# Patient Record
Sex: Female | Born: 1985 | Race: Black or African American | Hispanic: No | Marital: Married | Smoking: Current some day smoker
Health system: Southern US, Community
[De-identification: ages and names within clinical notes are randomized; demographics above are authoritative.]

## PROBLEM LIST (undated history)

## (undated) DIAGNOSIS — O99119 Other diseases of the blood and blood-forming organs and certain disorders involving the immune mechanism complicating pregnancy, unspecified trimester: Secondary | ICD-10-CM

## (undated) DIAGNOSIS — R519 Headache, unspecified: Secondary | ICD-10-CM

## (undated) DIAGNOSIS — D6851 Activated protein C resistance: Secondary | ICD-10-CM

## (undated) DIAGNOSIS — Z789 Other specified health status: Secondary | ICD-10-CM

## (undated) HISTORY — PX: TUBAL LIGATION: SHX77

## (undated) HISTORY — DX: Headache, unspecified: R51.9

## (undated) HISTORY — PX: NO PAST SURGERIES: SHX2092

---

## 2007-08-27 ENCOUNTER — Emergency Department (HOSPITAL_COMMUNITY): Admission: EM | Admit: 2007-08-27 | Discharge: 2007-08-27 | Payer: Self-pay | Admitting: Emergency Medicine

## 2008-09-14 ENCOUNTER — Inpatient Hospital Stay (HOSPITAL_COMMUNITY): Admission: AD | Admit: 2008-09-14 | Discharge: 2008-09-14 | Payer: Self-pay | Admitting: Family Medicine

## 2009-09-13 ENCOUNTER — Emergency Department (HOSPITAL_COMMUNITY): Admission: EM | Admit: 2009-09-13 | Discharge: 2009-09-13 | Payer: Self-pay | Admitting: Emergency Medicine

## 2009-10-08 ENCOUNTER — Emergency Department (HOSPITAL_COMMUNITY): Admission: EM | Admit: 2009-10-08 | Discharge: 2009-10-08 | Payer: Self-pay | Admitting: Family Medicine

## 2010-09-25 ENCOUNTER — Inpatient Hospital Stay (HOSPITAL_COMMUNITY): Payer: Medicaid Other

## 2010-09-25 ENCOUNTER — Inpatient Hospital Stay (HOSPITAL_COMMUNITY)
Admission: AD | Admit: 2010-09-25 | Discharge: 2010-09-25 | Disposition: A | Discharge status: Home / Self Care | Payer: Medicaid Other | Source: Ambulatory Visit | Attending: Obstetrics and Gynecology | Admitting: Obstetrics and Gynecology

## 2010-09-25 DIAGNOSIS — N760 Acute vaginitis: Secondary | ICD-10-CM | POA: Insufficient documentation

## 2010-09-25 DIAGNOSIS — A499 Bacterial infection, unspecified: Secondary | ICD-10-CM | POA: Insufficient documentation

## 2010-09-25 DIAGNOSIS — O2390 Unspecified genitourinary tract infection in pregnancy, unspecified trimester: Secondary | ICD-10-CM | POA: Insufficient documentation

## 2010-09-25 DIAGNOSIS — O209 Hemorrhage in early pregnancy, unspecified: Secondary | ICD-10-CM | POA: Insufficient documentation

## 2010-09-25 DIAGNOSIS — O039 Complete or unspecified spontaneous abortion without complication: Secondary | ICD-10-CM | POA: Insufficient documentation

## 2010-09-25 DIAGNOSIS — B9689 Other specified bacterial agents as the cause of diseases classified elsewhere: Secondary | ICD-10-CM | POA: Insufficient documentation

## 2010-09-25 LAB — WET PREP, GENITAL: Trich, Wet Prep: NONE SEEN

## 2010-09-25 LAB — URINALYSIS, ROUTINE W REFLEX MICROSCOPIC
Bilirubin Urine: NEGATIVE
Glucose, UA: NEGATIVE mg/dL
Hgb urine dipstick: NEGATIVE
Ketones, ur: NEGATIVE mg/dL
Specific Gravity, Urine: 1.015 (ref 1.005–1.030)
pH: 6.5 (ref 5.0–8.0)

## 2010-09-27 LAB — GC/CHLAMYDIA PROBE AMP, GENITAL
Chlamydia, DNA Probe: NEGATIVE
GC Probe Amp, Genital: NEGATIVE

## 2010-10-02 DEATH — deceased

## 2010-10-13 LAB — WET PREP, GENITAL: Yeast Wet Prep HPF POC: NONE SEEN

## 2010-10-13 LAB — HCG, QUANTITATIVE, PREGNANCY: hCG, Beta Chain, Quant, S: 39 m[IU]/mL — ABNORMAL HIGH (ref <5)

## 2010-11-11 ENCOUNTER — Ambulatory Visit (HOSPITAL_COMMUNITY)
Admission: RE | Admit: 2010-11-11 | Discharge: 2010-11-11 | Disposition: A | Discharge status: Home / Self Care | Payer: Medicaid Other | Source: Ambulatory Visit | Attending: Obstetrics and Gynecology | Admitting: Obstetrics and Gynecology

## 2010-11-11 ENCOUNTER — Ambulatory Visit (HOSPITAL_COMMUNITY)
Admission: RE | Admit: 2010-11-11 | Discharge: 2010-11-11 | Disposition: A | Discharge status: Home / Self Care | Payer: Medicaid Other | Source: Ambulatory Visit

## 2010-11-11 DIAGNOSIS — N96 Recurrent pregnancy loss: Secondary | ICD-10-CM | POA: Insufficient documentation

## 2011-07-04 NOTE — L&D Delivery Note (Signed)
Delivery Note At 9:31 AM a viable female was delivered via Vaginal, Spontaneous Delivery (Presentation: Right Occiput Posterior).  APGAR: 8, 9; weight .   Placenta status: Intact, Spontaneous.  Cord: 3 vessels with the following complications: None.   Anesthesia: Epidural  Episiotomy: None Lacerations: None Suture Repair: none Est. Blood Loss (mL): 100  Mom to postpartum.  Baby to room in with mom.  Dagmar Adcox 05/01/2012, 9:44 AM

## 2011-08-09 ENCOUNTER — Ambulatory Visit: Payer: Self-pay

## 2011-08-09 ENCOUNTER — Ambulatory Visit: Payer: Self-pay | Admitting: Family Medicine

## 2011-08-09 VITALS — BP 112/78 | HR 112 | Temp 101.4°F | Resp 18 | Ht 63.5 in | Wt 179.6 lb

## 2011-08-09 DIAGNOSIS — M79603 Pain in arm, unspecified: Secondary | ICD-10-CM

## 2011-08-09 DIAGNOSIS — S53409A Unspecified sprain of unspecified elbow, initial encounter: Secondary | ICD-10-CM

## 2011-08-09 DIAGNOSIS — J020 Streptococcal pharyngitis: Secondary | ICD-10-CM

## 2011-08-09 DIAGNOSIS — IMO0002 Reserved for concepts with insufficient information to code with codable children: Secondary | ICD-10-CM

## 2011-08-09 DIAGNOSIS — M79609 Pain in unspecified limb: Secondary | ICD-10-CM

## 2011-08-09 DIAGNOSIS — J029 Acute pharyngitis, unspecified: Secondary | ICD-10-CM

## 2011-08-09 LAB — POCT RAPID STREP A (OFFICE): Rapid Strep A Screen: POSITIVE — AB

## 2011-08-09 MED ORDER — NAPROXEN 500 MG PO TABS: 500.0000 mg | ORAL_TABLET | Freq: Two times a day (BID) | ORAL | Status: DC

## 2011-08-09 MED ORDER — AMOXICILLIN 875 MG PO TABS: 875.0000 mg | ORAL_TABLET | Freq: Two times a day (BID) | ORAL | Status: AC

## 2011-08-09 NOTE — Patient Instructions (Signed)
Thank you for coming in today.  I appreciate your patience as we become more comfortable with our computer system.  Today you saw Angel Potenza, MD. I hope you feel better quickly. If you were not given printed prescriptions today, your medications have been sent to your specified pharmacy and can be picked up there.   

## 2011-08-09 NOTE — Progress Notes (Signed)
  Subjective:    Patient ID: Trellis Moment, female    DOB: [birth year], 26 y.o.   MRN: [id number]  HPI 26 yo female here for arm pain - boyfriend fell on arm yesterday.  Swollen.  Can't bend or fully straighten.  Using ice. Normal sensation and movement in fingers.    Also - fever today.  Headache,  Slight cough, sore throat.  No flu shot.  Achy.  No ear pain.  Slight runny nose.  Symptoms started yest.  Fever today.    Review of Systems Negative except as per HPI     Objective:   Physical Exam  Constitutional: She appears well-developed and well-nourished.  HENT:  Mouth/Throat: Uvula is midline and mucous membranes are normal. Oropharyngeal exudate and posterior oropharyngeal erythema present.  Eyes: Conjunctivae and EOM are normal.  Neck: Normal range of motion.  Cardiovascular: Normal rate, regular rhythm and normal heart sounds.   Pulmonary/Chest: Effort normal and breath sounds normal.  Musculoskeletal:       Left elbow: She exhibits decreased range of motion and swelling. She exhibits no effusion and no deformity. tenderness found. Medial epicondyle and lateral epicondyle tenderness noted.  Lymphadenopathy:    She has no cervical adenopathy.    Results for orders placed in visit on 08/09/11  POCT RAPID STREP A (OFFICE)      Component Value Range   Rapid Strep A Screen Positive (*) Negative     [facility] Primary radiology reading by Dr. Georgiana Shore: No fracture seen       Assessment & Plan:  Elbow pain Sprain  Sling, ice, naproxen.  F/u if not better in 3-5 days  St, fever - strep.  Amox to pharmacy.

## 2011-08-23 ENCOUNTER — Emergency Department (HOSPITAL_COMMUNITY)
Admission: EM | Admit: 2011-08-23 | Discharge: 2011-08-23 | Disposition: A | Discharge status: Home / Self Care | Payer: Self-pay | Attending: Emergency Medicine | Admitting: Emergency Medicine

## 2011-08-23 ENCOUNTER — Encounter (HOSPITAL_COMMUNITY): Payer: Self-pay | Admitting: *Deleted

## 2011-08-23 DIAGNOSIS — B9689 Other specified bacterial agents as the cause of diseases classified elsewhere: Secondary | ICD-10-CM | POA: Insufficient documentation

## 2011-08-23 DIAGNOSIS — N760 Acute vaginitis: Secondary | ICD-10-CM | POA: Insufficient documentation

## 2011-08-23 DIAGNOSIS — A499 Bacterial infection, unspecified: Secondary | ICD-10-CM | POA: Insufficient documentation

## 2011-08-23 DIAGNOSIS — L293 Anogenital pruritus, unspecified: Secondary | ICD-10-CM | POA: Insufficient documentation

## 2011-08-23 LAB — URINALYSIS, ROUTINE W REFLEX MICROSCOPIC
Bilirubin Urine: NEGATIVE
Hgb urine dipstick: NEGATIVE
Nitrite: NEGATIVE
Protein, ur: NEGATIVE mg/dL
Specific Gravity, Urine: 1.021 (ref 1.005–1.030)
Urobilinogen, UA: 1 mg/dL (ref 0.0–1.0)

## 2011-08-23 LAB — URINE MICROSCOPIC-ADD ON

## 2011-08-23 LAB — WET PREP, GENITAL

## 2011-08-23 MED ORDER — METRONIDAZOLE 500 MG PO TABS: 500.0000 mg | ORAL_TABLET | Freq: Two times a day (BID) | ORAL | Status: AC

## 2011-08-23 MED ORDER — AZITHROMYCIN 250 MG PO TABS
1000.0000 mg | ORAL_TABLET | Freq: Once | ORAL | Status: AC
  Administered 2011-08-23: 1000 mg via ORAL
  Filled 2011-08-23: qty 4

## 2011-08-23 MED ORDER — IBUPROFEN 800 MG PO TABS: 800.0000 mg | ORAL_TABLET | Freq: Once | ORAL | Status: DC

## 2011-08-23 MED ORDER — CEFTRIAXONE SODIUM 250 MG IJ SOLR
250.0000 mg | Freq: Once | INTRAMUSCULAR | Status: AC
  Administered 2011-08-23: 250 mg via INTRAMUSCULAR
  Filled 2011-08-23: qty 250

## 2011-08-23 MED ORDER — MICONAZOLE NITRATE 2 % EX CREA: TOPICAL_CREAM | Freq: Two times a day (BID) | CUTANEOUS | Status: DC

## 2011-08-23 NOTE — ED Notes (Signed)
Patient aware of need for urine testing. Patient unable to void at this time. Patient encouraged to call for toileting assistance  

## 2011-08-23 NOTE — ED Provider Notes (Signed)
History     CSN: [id number]  Arrival date & time 08/23/11  1628   First MD Initiated Contact with Patient 08/23/11 1637      Chief Complaint  Patient presents with  . SEXUALLY TRANSMITTED DISEASE    (Consider location/radiation/quality/duration/timing/severity/associated sxs/prior treatment) HPI  Patient who is G3P0A3 with LMP on January 20th presents to ER complaining of a one week hx of vaginal itching but denies fevers, chills, abdominal pain, n/v/d, pelvic pain or dyspareunia. Patient states she recently completed abx for strep throat stating "sore throat is gone." Patient states she has hx of gonorrhea and chlamydia but denies vaginal d/c. Symptoms were gradual onset, persistent and unchanging. She has taken nothing for symptoms PTA. Denies aggravating or alleviating factors.   History reviewed. No pertinent past medical history.  History reviewed. No pertinent past surgical history.  History reviewed. No pertinent family history.  History  Substance Use Topics  . Smoking status: Former Smoker    Types: Cigarettes    Quit date: 04/08/2011  . Smokeless tobacco: Not on file  . Alcohol Use: Not on file    OB History    Grav Para Term Preterm Abortions TAB SAB Ect Mult Living                  Review of Systems  All other systems reviewed and are negative.    Allergies  Review of patient's allergies indicates no known allergies.  Home Medications   Current Outpatient Rx  Name Route Sig Dispense Refill  . NAPROXEN 500 MG PO TABS Oral Take 1 tablet (500 mg total) by mouth 2 (two) times daily with a meal. 30 tablet 0    BP 127/77  Pulse 101  Temp(Src) 98.3 F (36.8 C) (Oral)  Resp 20  SpO2 100%  LMP 07/23/2011  Physical Exam  Nursing note and vitals reviewed. Constitutional: She is oriented to person, place, and time. She appears well-developed and well-nourished. No distress.  HENT:  Head: Normocephalic and atraumatic.  Eyes: Conjunctivae are normal.    Neck: Normal range of motion. Neck supple.  Cardiovascular: Normal rate, regular rhythm, normal heart sounds and intact distal pulses.  Exam reveals no gallop and no friction rub.   No murmur heard. Pulmonary/Chest: Effort normal and breath sounds normal. No respiratory distress. She has no wheezes. She has no rales. She exhibits no tenderness.  Abdominal: Soft. Bowel sounds are normal. She exhibits no distension and no mass. There is no tenderness. There is no rebound and no guarding.  Genitourinary: There is no tenderness or lesion on the right labia. There is no tenderness or lesion on the left labia. Cervix exhibits no motion tenderness, no discharge and no friability. Right adnexum displays no mass and no tenderness. Left adnexum displays no mass and no tenderness. Vaginal discharge found.       Thick white scant vaginal d/c. No cervical d/c. No CMT or adnexal TTP.   Musculoskeletal: Normal range of motion. She exhibits no edema and no tenderness.  Neurological: She is alert and oriented to person, place, and time.  Skin: Skin is warm and dry. No rash noted. She is not diaphoretic. No erythema.  Psychiatric: She has a normal mood and affect.    ED Course  Procedures (including critical care time)  Patient is requesting prophylactic treatment for GC and chlamydia.  IM rocephin and PO zithromax.   Labs Reviewed  URINALYSIS, ROUTINE W REFLEX MICROSCOPIC - Abnormal; Notable for the following:  Leukocytes, UA SMALL (*)    All other components within normal limits  WET PREP, GENITAL - Abnormal; Notable for the following:    Clue Cells Wet Prep HPF POC MODERATE (*)    WBC, Wet Prep HPF POC FEW (*)    All other components within normal limits  POCT PREGNANCY, URINE  URINE MICROSCOPIC-ADD ON  GC/CHLAMYDIA PROBE AMP, GENITAL   No results found.   1. Bacterial vaginosis       MDM  Patient with BV, though negative for yeast on wet prep, question yeast component. Patient  prophylacticly treated for GC/chlamydia given hx of STDs. Abdomen is soft and non tender. No CMT or adnexal TTP.         Jenness [place], [place] 08/23/11 [phone]

## 2011-08-23 NOTE — ED Notes (Signed)
Pt in c/o itching to vaginal area x2 weeks

## 2011-08-23 NOTE — Discharge Instructions (Signed)
Take flagyl in completion and use miconazole as directed. Follow up with the [facility] STD clinic in the future with concerns of potential STD infections.   Bacterial Vaginosis Bacterial vaginosis (BV) is a vaginal infection where the normal balance of bacteria in the vagina is disrupted. The normal balance is then replaced by an overgrowth of certain bacteria. There are several different kinds of bacteria that can cause BV. BV is the most common vaginal infection in women of childbearing age. CAUSES   The cause of BV is not fully understood. BV develops when there is an increase or imbalance of harmful bacteria.   Some activities or behaviors can upset the normal balance of bacteria in the vagina and put women at increased risk including:   Having a new sex partner or multiple sex partners.   Douching.   Using an intrauterine device (IUD) for contraception.   It is not clear what role sexual activity plays in the development of BV. However, women that have never had sexual intercourse are rarely infected with BV.  Women do not get BV from toilet seats, bedding, swimming pools or from touching objects around them.  SYMPTOMS   Grey vaginal discharge.   A fish-like odor with discharge, especially after sexual intercourse.   Itching or burning of the vagina and vulva.   Burning or pain with urination.   Some women have no signs or symptoms at all.  DIAGNOSIS  Your caregiver must examine the vagina for signs of BV. Your caregiver will perform lab tests and look at the sample of vaginal fluid through a microscope. They will look for bacteria and abnormal cells (clue cells), a pH test higher than 4.5, and a positive amine test all associated with BV.  RISKS AND COMPLICATIONS   Pelvic inflammatory disease (PID).   Infections following gynecology surgery.   Developing HIV.   Developing herpes virus.  TREATMENT  Sometimes BV will clear up without treatment.  However, all women with symptoms of BV should be treated to avoid complications, especially if gynecology surgery is planned. Female partners generally do not need to be treated. However, BV may spread between female sex partners so treatment is helpful in preventing a recurrence of BV.   BV may be treated with antibiotics. The antibiotics come in either pill or vaginal cream forms. Either can be used with nonpregnant or pregnant women, but the recommended dosages differ. These antibiotics are not harmful to the baby.   BV can recur after treatment. If this happens, a second round of antibiotics will often be prescribed.   Treatment is important for pregnant women. If not treated, BV can cause a premature delivery, especially for a pregnant woman who had a premature birth in the past. All pregnant women who have symptoms of BV should be checked and treated.   For chronic reoccurrence of BV, treatment with a type of prescribed gel vaginally twice a week is helpful.  HOME CARE INSTRUCTIONS   Finish all medication as directed by your caregiver.   Do not have sex until treatment is completed.   Tell your sexual partner that you have a vaginal infection. They should see their caregiver and be treated if they have problems, such as a mild rash or itching.   Practice safe sex. Use condoms. Only have 1 sex partner.  PREVENTION  Basic prevention steps can help reduce the risk of upsetting the natural balance of bacteria in the vagina and developing BV:  Do not  have sexual intercourse (be abstinent).   Do not douche.   Use all of the medicine prescribed for treatment of BV, even if the signs and symptoms go away.   Tell your sex partner if you have BV. That way, they can be treated, if needed, to prevent reoccurrence.  SEEK MEDICAL CARE IF:   Your symptoms are not improving after 3 days of treatment.   You have increased discharge, pain, or fever.  MAKE SURE YOU:   Understand these  instructions.   Will watch your condition.   Will get help right away if you are not doing well or get worse.  FOR MORE INFORMATION  Division of STD Prevention (DSTDP), Centers for Disease Control and Prevention: [employer] American Social Health Association (ASHA): www.ashastd.org  Document Released: 06/19/2005 Document Revised: 03/01/2011 Document Reviewed: 12/10/2008 [facility] Patient Information 2012 [place], [place].

## 2011-08-23 NOTE — ED Notes (Signed)
Patient attempted to void in cup and missed cup or forgot to go in cup.

## 2011-08-23 NOTE — ED Notes (Signed)
Pt given discharge instructions and verb understanding, amb with steady gait to discharge window 

## 2011-08-24 LAB — GC/CHLAMYDIA PROBE AMP, GENITAL: Chlamydia, DNA Probe: NEGATIVE

## 2011-08-24 NOTE — ED Provider Notes (Signed)
Medical screening examination/treatment/procedure(s) were performed by non-physician practitioner and as supervising physician I was immediately available for consultation/collaboration.   Gerhard Munch, MD 08/24/11 0001

## 2011-09-28 LAB — OB RESULTS CONSOLE HEPATITIS B SURFACE ANTIGEN: Hepatitis B Surface Ag: NEGATIVE

## 2011-09-29 LAB — OB RESULTS CONSOLE RPR: RPR: NONREACTIVE

## 2011-09-29 LAB — OB RESULTS CONSOLE GC/CHLAMYDIA: Chlamydia: POSITIVE

## 2011-11-01 ENCOUNTER — Other Ambulatory Visit (HOSPITAL_COMMUNITY): Payer: Self-pay | Admitting: Physician Assistant

## 2011-11-01 DIAGNOSIS — Z0489 Encounter for examination and observation for other specified reasons: Secondary | ICD-10-CM

## 2011-11-01 LAB — OB RESULTS CONSOLE ANTIBODY SCREEN: Antibody Screen: NEGATIVE

## 2011-11-28 ENCOUNTER — Ambulatory Visit (HOSPITAL_COMMUNITY)
Admission: RE | Admit: 2011-11-28 | Discharge: 2011-11-28 | Disposition: A | Discharge status: Home / Self Care | Payer: Medicaid Other | Source: Ambulatory Visit | Attending: Physician Assistant | Admitting: Physician Assistant

## 2011-11-28 DIAGNOSIS — Z1389 Encounter for screening for other disorder: Secondary | ICD-10-CM | POA: Insufficient documentation

## 2011-11-28 DIAGNOSIS — O2620 Pregnancy care for patient with recurrent pregnancy loss, unspecified trimester: Secondary | ICD-10-CM | POA: Insufficient documentation

## 2011-11-28 DIAGNOSIS — Z363 Encounter for antenatal screening for malformations: Secondary | ICD-10-CM | POA: Insufficient documentation

## 2011-11-28 DIAGNOSIS — O358XX0 Maternal care for other (suspected) fetal abnormality and damage, not applicable or unspecified: Secondary | ICD-10-CM | POA: Insufficient documentation

## 2011-11-28 DIAGNOSIS — Z0489 Encounter for examination and observation for other specified reasons: Secondary | ICD-10-CM

## 2011-12-30 LAB — OB RESULTS CONSOLE ABO/RH: RH Type: POSITIVE

## 2012-04-30 ENCOUNTER — Inpatient Hospital Stay (HOSPITAL_COMMUNITY)
Admission: AD | Admit: 2012-04-30 | Discharge: 2012-05-03 | DRG: 775 | Disposition: A | Discharge status: Home / Self Care | Payer: Medicaid Other | Source: Ambulatory Visit | Attending: Obstetrics & Gynecology | Admitting: Obstetrics & Gynecology

## 2012-04-30 ENCOUNTER — Encounter (HOSPITAL_COMMUNITY): Payer: Self-pay | Admitting: *Deleted

## 2012-04-30 HISTORY — DX: Other specified health status: Z78.9

## 2012-04-30 LAB — CBC
HCT: 32.1 % — ABNORMAL LOW (ref 36.0–46.0)
MCHC: 33.6 g/dL (ref 30.0–36.0)
MCV: 86.8 fL (ref 78.0–100.0)
Platelets: 248 10*3/uL (ref 150–400)
RDW: 14.5 % (ref 11.5–15.5)

## 2012-04-30 LAB — RAPID HIV SCREEN (WH-MAU): Rapid HIV Screen: NONREACTIVE

## 2012-04-30 MED ORDER — ONDANSETRON HCL 4 MG/2ML IJ SOLN: 4.0000 mg | Freq: Four times a day (QID) | INTRAMUSCULAR | Status: DC | PRN

## 2012-04-30 MED ORDER — CITRIC ACID-SODIUM CITRATE 334-500 MG/5ML PO SOLN
30.0000 mL | ORAL | Status: DC | PRN
  Filled 2012-04-30: qty 15

## 2012-04-30 MED ORDER — LACTATED RINGERS IV SOLN: 500.0000 mL | INTRAVENOUS | Status: DC | PRN

## 2012-04-30 MED ORDER — IBUPROFEN 600 MG PO TABS: 600.0000 mg | ORAL_TABLET | Freq: Four times a day (QID) | ORAL | Status: DC | PRN

## 2012-04-30 MED ORDER — OXYTOCIN 40 UNITS IN LACTATED RINGERS INFUSION - SIMPLE MED: 62.5000 mL/h | INTRAVENOUS | Status: DC

## 2012-04-30 MED ORDER — LIDOCAINE HCL (PF) 1 % IJ SOLN: 30.0000 mL | INTRAMUSCULAR | Status: DC | PRN

## 2012-04-30 MED ORDER — OXYTOCIN BOLUS FROM INFUSION
500.0000 mL | INTRAVENOUS | Status: DC
  Filled 2012-04-30 (×54): qty 500

## 2012-04-30 MED ORDER — FLEET ENEMA 7-19 GM/118ML RE ENEM: 1.0000 | ENEMA | RECTAL | Status: DC | PRN

## 2012-04-30 MED ORDER — LACTATED RINGERS IV SOLN
INTRAVENOUS | Status: DC
  Administered 2012-05-01: 09:00:00 via INTRAVENOUS

## 2012-04-30 MED ORDER — OXYCODONE-ACETAMINOPHEN 5-325 MG PO TABS: 1.0000 | ORAL_TABLET | ORAL | Status: DC | PRN

## 2012-04-30 MED ORDER — ACETAMINOPHEN 325 MG PO TABS: 650.0000 mg | ORAL_TABLET | ORAL | Status: DC | PRN

## 2012-04-30 NOTE — H&P (Signed)
Angel Ho is a 26 y.o. female presenting for SROM.  Patient [facility].  She presented for a prenatal visit and her membranes ruptured while waiting to see her provider.  Patient is G4P0030 at [redacted]w[redacted]d.  Dating based on LMP and confirmatory [place].  Maternal Medical History:  Reason for admission: Reason for admission: rupture of membranes.  Clear fluid at 10:52 am, 04/30/12  Contractions: Onset was yesterday.   Frequency: regular.   Duration is approximately 60 seconds.   Perceived severity is moderate.    Fetal activity: Perceived fetal activity is normal.   Last perceived fetal movement was within the past hour.    Prenatal complications: No bleeding, hypertension, infection, IUGR, pre-eclampsia or preterm labor.   Prenatal Complications - Diabetes: none.    OB History    Grav Para Term Preterm Abortions TAB SAB Ect Mult Living   4    3  3         Past Medical History  Diagnosis Date  . No pertinent past medical history    Past Surgical History  Procedure Date  . No past surgeries    Family History: family history is not on file. Social History:  reports that she quit smoking about 12 months ago. Her smoking use included Cigarettes. She does not have any smokeless tobacco history on file. She reports that she does not drink alcohol or use illicit drugs.   Prenatal Transfer Tool  Maternal Diabetes: No Genetic Screening: Normal Maternal Ultrasounds/Referrals: Normal Fetal Ultrasounds or other Referrals:  None Maternal Substance Abuse:  No Significant Maternal Medications:  None Significant Maternal Lab Results:  Lab values include: Group B Strep negative, Other:  Other Comments:  CT positive, TOC negative  Review of Systems  Constitutional: Negative.   HENT: Negative.   Eyes: Negative.  Negative for blurred vision and double vision.  Respiratory: Negative.   Cardiovascular: Negative.   Gastrointestinal: Negative.        No  epigastric pain reported.  Genitourinary: Negative.   Musculoskeletal: Negative.   Skin: Negative.   Neurological: Negative.  Negative for headaches.  Endo/Heme/Allergies: Negative.   Psychiatric/Behavioral: Negative.       Blood pressure 131/80, pulse 89, temperature 97.9 F (36.6 C), temperature source Oral, resp. rate 20, height 5\' 3"  (1.6 m), weight 106.595 kg (235 lb), last menstrual period 07/23/2011.  Maternal Exam:  Uterine Assessment: Contraction strength is mild.  Contraction duration is 60 seconds. Contraction frequency is regular.   Abdomen: Patient reports no abdominal tenderness. Fundal height is size equals dates.   Vertex confirmed positive by health department  Introitus: Ferning test: positive.  Nitrazine test: not done. Amniotic fluid character: clear.     Fetal Exam Fetal Monitor Review: Mode: ultrasound.   Baseline rate: 130.  Variability: moderate (6-25 bpm).   Pattern: accelerations present and no decelerations.    Fetal State Assessment: Category I - tracings are normal.     Physical Exam  Nursing note reviewed. Constitutional: She is oriented to person, place, and time. She appears well-developed and well-nourished.  HENT:  Head: Normocephalic and atraumatic.  Eyes: Conjunctivae normal and EOM are normal. Pupils are equal, round, and reactive to light.  Neck: Normal range of motion. Neck supple.  Cardiovascular: Normal rate, regular rhythm and normal heart sounds.   Respiratory: Effort normal and breath sounds normal.  GI: Soft.  Musculoskeletal: She exhibits edema.  Neurological: She is alert and oriented to person, place, and time.  Skin: Skin  is warm and dry.  Psychiatric: She has a normal mood and affect. Her behavior is normal. Judgment and thought content normal.    Prenatal labs: ABO, Rh: AB/Positive/-- (06/29 0000) Antibody:  Negative Rubella: Immune (03/29 0000) RPR: Nonreactive (03/29 0000)  HBsAg: Negative (03/28 0000)    HIV:   Negative GBS: Negative (10/08 0000)  1 hour GTT: 99 Normal Quad screen & CF negative  Assessment/Plan:  [redacted]w[redacted]d intrauterine pregnancy FHR Category I SROM early labor, SVE deferred  Admit to birthing suites, routine labor orders, expectant management, augment prn    Nira Conn 04/30/2012, 3:26 PM   I was present for the exam and agree with above.  [place], CNM 04/30/2012 5:03 PM

## 2012-04-30 NOTE — MAU Note (Signed)
Pt states was at [facility] and water broke at 1045, clear fluid, last week was not dilated. Fern slide brought over by pt, is positive. Has had ctx's, have slowed down now to q10 minutes apart.

## 2012-04-30 NOTE — Progress Notes (Signed)
Angel Ho is a 26 y.o. G4P0030 at [redacted]w[redacted]d.  Subjective: Mild discomfort w/ UC's  Objective: BP 131/80  Pulse 89  Temp 97.9 F (36.6 C) (Oral)  Resp 20  Ht 5\' 3"  (1.6 m)  Wt 106.595 kg (235 lb)  BMI 41.63 kg/m2  LMP 07/23/2011   FHT:  FHR: 130-140 bpm, variability: moderate,  accelerations:  Present,  decelerations:  Absent UC:   irregular, every 2-5 minutes, mild SVE: 1/80/-2, anterior, firm, leaking small amount of clear fluid.  Labs: Lab Results  Component Value Date   WBC 6.5 04/30/2012   HGB 10.8* 04/30/2012   HCT 32.1* 04/30/2012   MCV 86.8 04/30/2012   PLT 248 04/30/2012    Assessment / Plan: SROM, early labor  Labor: Progressing normally Preeclampsia:  NA Fetal Wellbeing:  Category I Pain Control:  Labor support without medications Anticipated MOD:  NSVD Discussed expectant management vs augmentation and R/B/I for each. Pt elects augmentation. Floley bulb placed w/out difficulty.   Dorathy Kinsman 04/30/2012, 2:48 PM

## 2012-05-01 ENCOUNTER — Encounter (HOSPITAL_COMMUNITY): Payer: Self-pay | Admitting: *Deleted

## 2012-05-01 ENCOUNTER — Encounter (HOSPITAL_COMMUNITY): Payer: Self-pay | Admitting: Anesthesiology

## 2012-05-01 ENCOUNTER — Inpatient Hospital Stay (HOSPITAL_COMMUNITY): Payer: Medicaid Other | Admitting: Anesthesiology

## 2012-05-01 MED ORDER — DIBUCAINE 1 % RE OINT: 1.0000 "application " | TOPICAL_OINTMENT | RECTAL | Status: DC | PRN

## 2012-05-01 MED ORDER — FENTANYL 2.5 MCG/ML BUPIVACAINE 1/10 % EPIDURAL INFUSION (WH - ANES)
INTRAMUSCULAR | Status: DC | PRN
  Administered 2012-05-01: 14 mL/h via EPIDURAL

## 2012-05-01 MED ORDER — ZOLPIDEM TARTRATE 5 MG PO TABS: 5.0000 mg | ORAL_TABLET | Freq: Every evening | ORAL | Status: DC | PRN

## 2012-05-01 MED ORDER — TERBUTALINE SULFATE 1 MG/ML IJ SOLN: 0.2500 mg | Freq: Once | INTRAMUSCULAR | Status: DC | PRN

## 2012-05-01 MED ORDER — PHENYLEPHRINE 40 MCG/ML (10ML) SYRINGE FOR IV PUSH (FOR BLOOD PRESSURE SUPPORT)
80.0000 ug | PREFILLED_SYRINGE | INTRAVENOUS | Status: DC | PRN
  Filled 2012-05-01: qty 5

## 2012-05-01 MED ORDER — SENNOSIDES-DOCUSATE SODIUM 8.6-50 MG PO TABS
2.0000 | ORAL_TABLET | Freq: Every day | ORAL | Status: DC
  Administered 2012-05-02: 2 via ORAL

## 2012-05-01 MED ORDER — ONDANSETRON HCL 4 MG PO TABS: 4.0000 mg | ORAL_TABLET | ORAL | Status: DC | PRN

## 2012-05-01 MED ORDER — EPHEDRINE 5 MG/ML INJ: 10.0000 mg | INTRAVENOUS | Status: DC | PRN

## 2012-05-01 MED ORDER — ONDANSETRON HCL 4 MG/2ML IJ SOLN: 4.0000 mg | INTRAMUSCULAR | Status: DC | PRN

## 2012-05-01 MED ORDER — INFLUENZA VIRUS VACC SPLIT PF IM SUSP
0.5000 mL | INTRAMUSCULAR | Status: AC
  Administered 2012-05-02: 0.5 mL via INTRAMUSCULAR
  Filled 2012-05-01: qty 0.5

## 2012-05-01 MED ORDER — LIDOCAINE HCL (PF) 1 % IJ SOLN
INTRAMUSCULAR | Status: DC | PRN
  Administered 2012-05-01 (×2): 9 mL

## 2012-05-01 MED ORDER — EPHEDRINE 5 MG/ML INJ
10.0000 mg | INTRAVENOUS | Status: DC | PRN
  Filled 2012-05-01: qty 4

## 2012-05-01 MED ORDER — DIPHENHYDRAMINE HCL 25 MG PO CAPS: 25.0000 mg | ORAL_CAPSULE | Freq: Four times a day (QID) | ORAL | Status: DC | PRN

## 2012-05-01 MED ORDER — PRENATAL MULTIVITAMIN CH
1.0000 | ORAL_TABLET | Freq: Every day | ORAL | Status: DC
  Administered 2012-05-02 – 2012-05-03 (×2): 1 via ORAL
  Filled 2012-05-01 (×2): qty 1

## 2012-05-01 MED ORDER — OXYCODONE-ACETAMINOPHEN 5-325 MG PO TABS: 1.0000 | ORAL_TABLET | ORAL | Status: DC | PRN

## 2012-05-01 MED ORDER — TETANUS-DIPHTH-ACELL PERTUSSIS 5-2.5-18.5 LF-MCG/0.5 IM SUSP: 0.5000 mL | Freq: Once | INTRAMUSCULAR | Status: DC

## 2012-05-01 MED ORDER — OXYTOCIN 40 UNITS IN LACTATED RINGERS INFUSION - SIMPLE MED
1.0000 m[IU]/min | INTRAVENOUS | Status: DC
  Administered 2012-05-01: 1 m[IU]/min via INTRAVENOUS
  Filled 2012-05-01: qty 1000

## 2012-05-01 MED ORDER — OXYTOCIN 40 UNITS IN LACTATED RINGERS INFUSION - SIMPLE MED: 1.0000 m[IU]/min | INTRAVENOUS | Status: DC

## 2012-05-01 MED ORDER — LACTATED RINGERS IV SOLN
500.0000 mL | Freq: Once | INTRAVENOUS | Status: AC
  Administered 2012-05-01: 1000 mL via INTRAVENOUS

## 2012-05-01 MED ORDER — IBUPROFEN 600 MG PO TABS
600.0000 mg | ORAL_TABLET | Freq: Four times a day (QID) | ORAL | Status: DC
  Administered 2012-05-01 – 2012-05-03 (×8): 600 mg via ORAL
  Filled 2012-05-01 (×8): qty 1

## 2012-05-01 MED ORDER — FENTANYL 2.5 MCG/ML BUPIVACAINE 1/10 % EPIDURAL INFUSION (WH - ANES)
14.0000 mL/h | INTRAMUSCULAR | Status: DC
  Filled 2012-05-01: qty 125

## 2012-05-01 MED ORDER — BENZOCAINE-MENTHOL 20-0.5 % EX AERO
1.0000 "application " | INHALATION_SPRAY | CUTANEOUS | Status: DC | PRN
  Administered 2012-05-01: 1 via TOPICAL
  Filled 2012-05-01: qty 56

## 2012-05-01 MED ORDER — SIMETHICONE 80 MG PO CHEW: 80.0000 mg | CHEWABLE_TABLET | ORAL | Status: DC | PRN

## 2012-05-01 MED ORDER — WITCH HAZEL-GLYCERIN EX PADS: 1.0000 "application " | MEDICATED_PAD | CUTANEOUS | Status: DC | PRN

## 2012-05-01 MED ORDER — PHENYLEPHRINE 40 MCG/ML (10ML) SYRINGE FOR IV PUSH (FOR BLOOD PRESSURE SUPPORT): 80.0000 ug | PREFILLED_SYRINGE | INTRAVENOUS | Status: DC | PRN

## 2012-05-01 MED ORDER — FENTANYL CITRATE 0.05 MG/ML IJ SOLN: 50.0000 ug | INTRAMUSCULAR | Status: DC | PRN

## 2012-05-01 MED ORDER — LANOLIN HYDROUS EX OINT: TOPICAL_OINTMENT | CUTANEOUS | Status: DC | PRN

## 2012-05-01 MED ORDER — DIPHENHYDRAMINE HCL 50 MG/ML IJ SOLN: 12.5000 mg | INTRAMUSCULAR | Status: DC | PRN

## 2012-05-01 NOTE — Anesthesia Procedure Notes (Signed)
Epidural Patient location during procedure: OB Start time: 05/01/2012 3:24 AM End time: 05/01/2012 3:28 AM  Staffing Anesthesiologist: Sandrea Hughs Performed by: anesthesiologist   Preanesthetic Checklist Completed: patient identified, site marked, surgical consent, pre-op evaluation, timeout performed, IV checked, risks and benefits discussed and monitors and equipment checked  Epidural Patient position: sitting Prep: site prepped and draped and DuraPrep Patient monitoring: continuous pulse ox and blood pressure Approach: midline Injection technique: LOR air  Needle:  Needle type: Tuohy  Needle gauge: 17 G Needle length: 9 cm and 9 Needle insertion depth: 7 cm Catheter type: closed end flexible Catheter size: 19 Gauge Catheter at skin depth: 12 cm Test dose: negative and Other  Assessment Sensory level: T8 Events: blood not aspirated, injection not painful, no injection resistance, negative IV test and no paresthesia  Additional Notes Reason for block:procedure for pain

## 2012-05-01 NOTE — Anesthesia Preprocedure Evaluation (Signed)
Anesthesia Evaluation  Patient identified by MRN, date of birth, ID band Patient awake    Reviewed: Allergy & Precautions, H&P , NPO status , Patient's Chart, lab work & pertinent test results  Airway Mallampati: II TM Distance: >3 FB Neck ROM: full    Dental No notable dental hx.    Pulmonary neg pulmonary ROS,  breath sounds clear to auscultation  Pulmonary exam normal       Cardiovascular negative cardio ROS      Neuro/Psych negative neurological ROS  negative psych ROS   GI/Hepatic negative GI ROS, Neg liver ROS,   Endo/Other  Morbid obesity  Renal/GU negative Renal ROS  negative genitourinary   Musculoskeletal negative musculoskeletal ROS (+)   Abdominal (+) + obese,   Peds negative pediatric ROS (+)  Hematology negative hematology ROS (+)   Anesthesia Other Findings   Reproductive/Obstetrics (+) Pregnancy                           Anesthesia Physical Anesthesia Plan  ASA: III  Anesthesia Plan: Epidural   Post-op Pain Management:    Induction:   Airway Management Planned:   Additional Equipment:   Intra-op Plan:   Post-operative Plan:   Informed Consent: I have reviewed the patients History and Physical, chart, labs and discussed the procedure including the risks, benefits and alternatives for the proposed anesthesia with the patient or authorized representative who has indicated his/her understanding and acceptance.     Plan Discussed with:   Anesthesia Plan Comments:         Anesthesia Quick Evaluation  

## 2012-05-01 NOTE — Progress Notes (Signed)
Faculty Practice OB/GYN Attending Note  Subjective:  Called to evaluate patient with FHR decelerations.  On evaluation, patient with baseline in 150s, repetitive prolonged variable decelerations to 90s lasting 2-5 minutes with return to baseline, moderate variability in between decelerations, during the last 20 minutes.  Intrauterine neonatal resuscitation techniques already employed. Good FM.    Objective:  Blood pressure 123/71, pulse 105, temperature 98.2 F (36.8 C), temperature source Oral, resp. rate 18, height 5\' 3"  (1.6 m), weight 106.595 kg (235 lb), last menstrual period 07/23/2011, SpO2 98.00%. FHT  As abovve Toco: q4-5 minutes Gen: NAD Cervix: 10/100/+2  Assessment & Plan:  26 y.o. G4P0030 at [redacted]w[redacted]d admitted in 2nd stage of labor, concerning FHT. Will start pushing, may need operative vaginal delivery if FHT remains concerning Hoping for SVD    Jaynie Collins, MD, FACOG Attending Obstetrician & Gynecologist Faculty Practice, [facility]

## 2012-05-01 NOTE — Progress Notes (Signed)
Patient ID: Angel Ho, female   DOB: Jan 21, 1986, 26 y.o.   MRN: [id number] Angel Ho is a 26 y.o. G4P0030 at [redacted]w[redacted]d.  Subjective: Moderate discomfort w/ UC's. Does not desire pain medication.  Objective: BP 130/85  Pulse 85  Temp 98.6 F (37 C) (Oral)  Resp 20  Ht 5\' 3"  (1.6 m)  Wt 106.595 kg (235 lb)  BMI 41.63 kg/m2  LMP 07/23/2011   FHT:  FHR: 130-140 bpm, variability: moderate,  accelerations:  Present,  decelerations:  Absent UC:  Regular every 2 minutes, moderate SVE: Dilation: 6 Effacement (%): 70 Station: -2 Presentation: Vertex Exam by:: c soliz rn   Labs: Lab Results  Component Value Date   WBC 6.5 04/30/2012   HGB 10.8* 04/30/2012   HCT 32.1* 04/30/2012   MCV 86.8 04/30/2012   PLT 248 04/30/2012    Assessment / Plan: SROM - active labor Will start Pitocin and continuous monitoring.  Labor: Progressing normally Preeclampsia:  NA Fetal Wellbeing:  Category I Pain Control:  Labor support without medications Anticipated MOD:  NSVD   Angel Ho 05/01/2012, 2:55 AM

## 2012-05-02 MED ORDER — IBUPROFEN 600 MG PO TABS: 600.0000 mg | ORAL_TABLET | Freq: Four times a day (QID) | ORAL | Status: DC

## 2012-05-02 NOTE — Anesthesia Postprocedure Evaluation (Signed)
  Anesthesia Post-op Note  Patient: Angel Ho  Procedure(s) Performed: * No procedures listed *  Patient Location: Mother/Baby  Anesthesia Type:Epidural  Level of Consciousness: awake, alert  and oriented  Airway and Oxygen Therapy: Patient Spontanous Breathing  Post-op Pain: none  Post-op Assessment: Patient's Cardiovascular Status Stable, Respiratory Function Stable, Patent Airway, NAUSEA AND VOMITING PRESENT, Adequate PO intake, Pain level controlled, No headache, No backache and No residual numbness  Post-op Vital Signs: Reviewed and stable  Complications: No apparent anesthesia complications

## 2012-05-02 NOTE — Progress Notes (Signed)
UR chart review completed.  

## 2012-05-02 NOTE — Discharge Summary (Signed)
I have seen and examined this patient and agree the above assessment. CRESENZO-DISHMAN,Durant Scibilia 05/02/2012 7:53 AM

## 2012-05-02 NOTE — Discharge Summary (Signed)
Obstetric Discharge Summary Reason for Admission: rupture of membranes Prenatal Procedures: none Intrapartum Procedures: spontaneous vaginal delivery Postpartum Procedures: none Complications-Operative and Postpartum: none  Angel Ho is a 26 y.o. female [id number] with SVD [redacted]w[redacted]d. No lacerations, no complications. Presented with SROM. Pt elected for induction with Foley bulb, which was followed by augmentation of labor with Pitocin. Pt is breastfeeding her baby boy. She is having some difficulty with this, has met with lactation consultant. While I was in her room this morning, she had a fairly successful feeding. She states he has trouble latching on but this is improving. She will have another lactation consult today before she leaves. Pt complains of mild lower abdominal pain/cramping. States it is partially relieved with ibuprofen, but pt states that she may ask for a stronger medication if it does not get better. Reports soaking through 1-2 pads throughout the night. Is up ad lib, tolerating PO very well. Has not had flatus or bowel movement yet. Pt would very much like to go home today, she feels ready to be discharged. Plans for circumcision of her baby boy at [facility]. [facility] Plans to have Mirena IUD placed at [facility]. Pt seems to be doing very well and is ready to be discharged today.  Hemoglobin  Date Value Range Status  04/30/2012 10.8* 12.0 - 15.0 g/dL Final     HCT  Date Value Range Status  04/30/2012 32.1* 36.0 - 46.0 % Final    Physical Exam:  General: alert, cooperative and no distress Lochia: appropriate Uterine Fundus: firm DVT Evaluation: No evidence of DVT seen on physical exam. No cords or calf tenderness. No significant calf/ankle edema.  Discharge Diagnoses: Term Pregnancy-delivered  Discharge Information: Date: 05/02/2012 Activity: unrestricted Diet: routine Medications:  Ibuprofen Condition: stable Discharge to: home   Newborn Data: Live born female  Birth Weight: 7 lb 3.9 oz (3286 g) APGAR: 8, 9  Home with mother.  Angel Ho 05/02/2012, 7:41 AM

## 2012-05-03 NOTE — Discharge Summary (Signed)
Obstetric Discharge Summary Reason for Admission: onset of labor and rupture of membranes Prenatal Procedures: ultrasound Intrapartum Procedures: spontaneous vaginal delivery Postpartum Procedures: none Complications-Operative and Postpartum: none Hemoglobin  Date Value Range Status  04/30/2012 10.8* 12.0 - 15.0 g/dL Final     HCT  Date Value Range Status  04/30/2012 32.1* 36.0 - 46.0 % Final    Physical Exam:  General: alert and cooperative Lochia: appropriate Uterine Fundus: firm Incision: none  DVT Evaluation: No evidence of DVT seen on physical exam. Negative Homan's sign. No cords or calf tenderness.  Discharge Diagnoses: Term Pregnancy-delivered  Discharge Information: Date: 05/03/2012 Activity: pelvic rest Diet:routine Medications: Ibuprofen Condition: stable Instructions: avs Discharge to: home Follow-up Information    Follow up with [facility] Schedule an appointment as soon as possible for a visit in 5 weeks. (Make sure to tell them you want the Mirena)          Newborn Data: Live born female  Birth Weight: 7 lb 3.9 oz (3286 g) APGAR: 8, 9  Home with mother.  Felix Pacini 05/03/2012, 8:51 AM  I examined pt and agree with documentation above and resident plan of care. [facility]

## 2012-05-03 NOTE — Discharge Summary (Signed)
Agree with above note.  Angel Ho H. 05/03/2012 3:11 PM

## 2014-01-01 ENCOUNTER — Encounter (HOSPITAL_COMMUNITY): Payer: Self-pay | Admitting: Emergency Medicine

## 2014-01-01 ENCOUNTER — Emergency Department (INDEPENDENT_AMBULATORY_CARE_PROVIDER_SITE_OTHER)
Admission: EM | Admit: 2014-01-01 | Discharge: 2014-01-01 | Disposition: A | Discharge status: Home / Self Care | Payer: Self-pay | Source: Home / Self Care | Attending: Family Medicine | Admitting: Family Medicine

## 2014-01-01 DIAGNOSIS — G44039 Episodic paroxysmal hemicrania, not intractable: Secondary | ICD-10-CM

## 2014-01-01 MED ORDER — INDOMETHACIN 25 MG PO CAPS: 25.0000 mg | ORAL_CAPSULE | Freq: Three times a day (TID) | ORAL | Status: DC | PRN

## 2014-01-01 NOTE — ED Provider Notes (Signed)
[name] is a 28 y.o. female who presents to Urgent Care today for headache. Patient has a several month history of right-sided sharp stabbing headaches. These occur multiple times daily and last approximately 30 minutes. She notes no significant runny nose or congestion. No nausea vomiting diarrhea or photophobia . No weakness or numbness or loss of function. Ibuprofen is only mildly helpful. No fevers or chills. Inconsistent with prior episodes of headache.   Past Medical History  Diagnosis Date  . No pertinent past medical history    History  Substance Use Topics  . Smoking status: Former Smoker    Types: Cigarettes    Quit date: 04/08/2011  . Smokeless tobacco: Not on file  . Alcohol Use: Yes     Comment: occasional   ROS as above Medications: No current facility-administered medications for this encounter.   Current Outpatient Prescriptions  Medication Sig Dispense Refill  . ibuprofen (ADVIL,MOTRIN) 600 MG tablet Take 600 mg by mouth every 6 (six) hours as needed for headache.      . indomethacin (INDOCIN) 25 MG capsule Take 1 capsule (25 mg total) by mouth 3 (three) times daily as needed.  60 capsule  0    Exam:  BP 114/73  Pulse 98  Temp(Src) 99.2 F (37.3 C) (Oral)  Resp 16  SpO2 100%  LMP 12/09/2013  Breastfeeding? No Gen: Well NAD HEENT: EOMI,  MMM PERRLA bilaterally. Normal funduscopic exam bilaterally. Lungs: Normal work of breathing. CTABL Heart: RRR no MRG Abd: NABS, Soft. NT, ND Exts: Brisk capillary refill, warm and well perfused.  Neuro: Alert and oriented cranial nerves II through XII are intact normal coordination reflexes sensation and strength. Normal gait and balance.  No results found for this or any previous visit (from the past 24 hour(s)). No results found.  Assessment and Plan: 28 y.o. female with headache. Unclear cause. Normal neurologic exam. 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 Trial of indomethacin. Refer to neurology if  possible. Otherwise followup with [facility]  Discussed warning signs or symptoms. Please see discharge instructions. Patient expresses understanding.    Rodolph BongEvan S Honestie Kulik, MD 01/01/14 2059

## 2014-01-01 NOTE — Discharge Instructions (Signed)
Thank you for coming in today. Take indomethacin three times daily.  This should help make a diagnosis.  Follow up with [facility] [address] [place], [place] [address] [phone]  Please call or see Ms Antionette Char for assistance with your bill.  You may qualify for reduced or free services.  Her phone number is [phone]. Her email is [email or web address]  Go to the emergency room if your headache becomes excruciating or you have weakness or numbness or uncontrolled vomiting.    Indomethacin-Responsive Headache, Adult Millions of people have headaches that come back over and over again. Some medicines stop the headache pain. Others do not. One type of medicine that often works is an NSAID (non-steroidal anti-inflammatory drug). There are many different NSAIDs. One is indomethacin (Indocin). It stops the body from making a substance that causes pain, fever, and swelling. Indomethacin can quickly stop the pain from certain types of headaches. Those headaches are called indomethacin-responsive headaches. They include:  Paroxysmal hemicrania. This is a series of short but severe headaches, usually on just one side of the head.  Hemicrania continua. Pain is nonstop and on one side of the face.  Primary exertional headache. Exercise sets off these headaches.  Primary cough headache. Pain may come from pressure in the brain when coughing or straining. There are other types of indomethacin-responsive headaches, but they are very rare. CAUSES  The exact cause of indomethacin-responsive headaches is not known. However, a few things seem to make it more likely that someone will have one of these headaches. They are called risk factors. There are also conditions that may set off a headache. They are called triggers. Triggers do not always start a headache. They may affect some people but not others. Risk factors and possible triggers  include:  For paroxysmal hemicrania:  Being 64 to 27 years old. That is when most attacks begin.  Being female.  Head trauma (serious injury).  Moving the head in certain ways.  Stress.  Exercise.  Pressure on sensitive areas of the neck.  Drinking alcohol.  For hemicrania continua:  Being female.  Having family members who get this type of headache.  Physical exertion, which may make the headache pain worse.  Drinking alcohol, which may make it worse.  For primary exertional headache:  Activity such as running, swimming, or weight-lifting.  Other exercise that is very strenuous (requires great effort). This includes having sex.  Being female.  Having had migraine headaches.  For primary cough headache:  Being female.  Being older than 40.  Coughing.  Sneezing.  Activities that involve stretching or straining. SYMPTOMS  Each type of indomethacin-responsive headache has different symptoms. What they have in common is that symptoms almost always get better quickly after indomethacin is taken.  Symptoms of paroxysmal hemicrania include:  On average, 10 headaches a day. Each lasts from a few minutes to a half-hour, or may last up to 2 hours.  Severe, throbbing pain.  Pain usually on just one side of the head. It often centers around the eye or in the forehead.  A watery eye, which becomes red and swollen, too.  Droopy or swollen eye lid.  Facial flushing and sweating.  A stuffy, runny nose.  Symptoms of hemicrania continua include:  All-day headache. This may occur daily for at least 3 months. Then there may be no headaches for weeks or months.  Pain that gets worse several times during the day.  Pain in the  face area, on one side only. It almost always occurs on the same side.  A watery eye. It also may become droopy, red, and swollen.  A stuffy, runny nose.  Pain that gets worse with sound or light.  Symptoms of primary exertional headache  include:  Pain after strenuous activity.  Throbbing pain. It is sometimes described as a "hammer blow to the head."  Pain that lasts 5 minutes to 48 hours. Sometimes even longer.  Symptoms of primary cough headache include:  Pain that starts after coughing, sneezing, or straining.  Sharp, stabbing pain.  Pain on both sides of the head. It is often worse in the back of the head.  Pain that is severe for a few minutes and then dull for several hours. DIAGNOSIS  Figuring out why someone has headaches can be a long process. It includes ruling out a medical condition that might be the cause, such as infections. To diagnose your headaches, your healthcare provider might:  Ask about your headaches.  Do a physical examination.  Order some tests. These may include:  Blood and urine tests. Checking for infections and toxins (poisons) in the body.  Lumbar puncture (spinal tap). A needle is used to take a sample of cerebrospinal fluid. This fluid protects the brain and spinal cord. The fluid is usually taken from the lower back. Checking the fluid will show if there is an infection, brain hemorrhage (bleeding), or extra pressure inside the skull.  Imaging tests. Creating pictures of blood vessels, bones, and the brain. Tests may include computed tomography (CT scan) or magnetic resonance imaging (MRI).  If no medical condition is causing the headaches, your healthcare provider may test treatments. For instance, you would take indomethacin during a typical attack. If yours is an indomethacin-responsive headache, symptoms should go away quickly. TREATMENT   Taking indomethacin is the best way to treat an indomethacin-responsive headache.  The medicine can be taken as a pill or liquid. It also may be given as a suppository (placed in the rectum).  It is usually started at a low dose. The dose may be increased until the pain stops.  A few people have side effects from indomethacin. These may  include stomachache, heartburn, nausea, vomiting, bleeding in the stomach, or rash.  Other medicines may also be given for indomethacin-responsive headaches. They include:  Other types of NSAIDs.  Antiemetics. These drugs relieve nausea and vomiting.  Antacids. These drugs relieve stomachache or heartburn. HOME CARE INSTRUCTIONS   Take indomethacin and any other medicines prescribed by your caregiver. Follow the directions carefully.  Do not take over-the-counter pain medicines, unless your caregiver says it is ok.  Be sure to tell your caregiver about any other medicines you take. This includes any drugs prescribed by another caregiver. It also includes herbs, supplements, eyedrops, and creams you may use.  Sometimes it helps to:  Rest in a dark, quiet room.  Put a cool, damp washcloth on your head or face.  Sleep.  Avoid triggers. What works for one person may not work for another. But consider these possibilities:  Go to bed at the same time each night. Get up at the same time every morning.  Do not skip meals. Eat on a regular schedule.  Stay away from stressful situations.  Avoid certain substances that trigger headaches in some people. These include foods such as cheese, processed meats, chocolate, and nuts. Also avoid caffeine, alcohol, nicotine (substance in tobacco).  Keep a headache diary. This may show  a pattern that can help caregivers find the best treatment for your headaches. Each time you have a headache, write down:  When it starts and stops. Include the day and time.  How it felt.  Whether light, sounds, or smells made the pain worse.  What you were doing right before the headache started.  How much sleep you had the night before.  Any stress you were feeling before the headache.  Everything you ate and drank for 24 hours before the pain started.  All medicines you took. SEEK MEDICAL CARE IF:   You have any questions about medicines.  Pain  continues, even after taking pain medicine.  You have nausea.  You develop a fever of more than 102 F (38.9 C). SEEK IMMEDIATE MEDICAL CARE IF:   Pain suddenly becomes much worse.  You have bad stomach pain or vomiting.  You vomit blood.  You develop a fever of more than 102 F (38.9 C). Document Released: 06/07/2009 Document Revised: 09/11/2011 Document Reviewed: 06/07/2009 [facility] Patient Information 2015 [employer]. This information is not intended to replace advice given to you by your health care provider. Make sure you discuss any questions you have with your health care provider.

## 2014-01-01 NOTE — ED Notes (Signed)
Pt. asked about neuro referral.  Dr. Denyse Amassorey said he would put it in the computer and they would call her.

## 2014-01-01 NOTE — ED Notes (Signed)
C/o sharp pain that lasts a few seconds and sometimes its pressure in R side of head onset a few months ago.  Pain is everyday.  No N or V.  No photophobia.  Has never had a CT scan.  Was told by neurologist office that she has to have a referral.

## 2014-02-20 ENCOUNTER — Encounter: Payer: Self-pay | Admitting: Neurology

## 2014-02-20 ENCOUNTER — Ambulatory Visit (INDEPENDENT_AMBULATORY_CARE_PROVIDER_SITE_OTHER): Payer: Self-pay | Admitting: Neurology

## 2014-02-20 VITALS — BP 124/84 | HR 100 | Ht 63.0 in | Wt 237.0 lb

## 2014-02-20 DIAGNOSIS — R519 Headache, unspecified: Secondary | ICD-10-CM

## 2014-02-20 DIAGNOSIS — R51 Headache: Secondary | ICD-10-CM

## 2014-02-20 DIAGNOSIS — G4485 Primary stabbing headache: Secondary | ICD-10-CM

## 2014-02-20 DIAGNOSIS — G44219 Episodic tension-type headache, not intractable: Secondary | ICD-10-CM

## 2014-02-20 MED ORDER — NORTRIPTYLINE HCL 10 MG PO CAPS: 10.0000 mg | ORAL_CAPSULE | Freq: Every day | ORAL | Status: DC

## 2014-02-20 NOTE — Patient Instructions (Addendum)
You probably had primary stabbing headache.  Now, you seem to have tension headaches 1.  We will start nortriptyline 10mg  at bedtime to help reduce frequency of the headaches.  Side effects may include sleepiness, dizziness and dry mouth.  Call in 4 weeks with update and we can adjust dose or medication. 2.  If you experience a stabbing headache, take the indomethacin.  Otherwise may use ibuprofen for the regular tension headaches.  Do not take pain relievers more than 2 days out of the week 3.  To be complete in a workup, we will get MRI of the brain with and without contrast and MRA of the head. [facility] 03/06/14 4:45 4.  Follow up in 3 months.

## 2014-02-20 NOTE — Progress Notes (Signed)
NEUROLOGY CONSULTATION NOTE  Angel Ho MRN: [id number] DOB: 1986-06-02  Referring provider: Dr. Logan Bores (ED) Primary care provider: none  Reason for consult:  headache  HISTORY OF PRESENT ILLNESS: Angel Ho is a 28 year old right-handed woman who presents for headache.  Records reviewed.  Onset:  3 to 4 months ago Location:  Right parietal region.  No neck pain. Quality:  Sharp, stabbing, sometimes with throbbing quality Intensity:  8/10 Aura:  no Prodrome:  no Associated symptoms:  None.  No nausea, vomiting, photophobia, phonophobia, osmophobia, visual disturbance, conjunctival injection or lacrimation, nasal congestion or facial swelling. Duration:  Few seconds Frequency:  Several times a day up until one month ago Triggers/exacerbating factors:  none Relieving factors:  indomethacin Activity:  Able to function  Past medication: Not applicable  Current abortive therapy:  Ibuprofen 800mg , indomethacin 25mg  Current preventative therapy:  None  Stabbing headaches improved after starting indomethacin a month ago.  She would take it as needed up to 3 times a day.  Now she has a bifrontal non-throbbing headache, not associated with other symptoms.  It lasts 2-3 hours and occurs twice a week.  There are no associated symptoms such as nausea, photophobia or phonophobia.  She takes ibuprofen or an indomethacin for it.  Caffeine:  Soda, tea Alcohol:  socially Smoker:  no Diet:  poor Exercise:  no Depression/stress:  Financial stress Sleep hygiene:  Good History of headache:  She began having occasional tension-type headaches after the birth of her son in 2013. Family history of headache:  No.  No family history of aneurysms. She had her eyes checked.  Headache persists despite corrective lenses.  PAST MEDICAL HISTORY: Past Medical History  Diagnosis Date  . No pertinent past medical history     PAST SURGICAL HISTORY: Past Surgical History  Procedure  Laterality Date  . No past surgeries      MEDICATIONS: Current Outpatient Prescriptions on File Prior to Visit  Medication Sig Dispense Refill  . ibuprofen (ADVIL,MOTRIN) 600 MG tablet Take 600 mg by mouth every 6 (six) hours as needed for headache.      . indomethacin (INDOCIN) 25 MG capsule Take 1 capsule (25 mg total) by mouth 3 (three) times daily as needed.  60 capsule  0   No current facility-administered medications on file prior to visit.    ALLERGIES: No Known Allergies  FAMILY HISTORY: Family History  Problem Relation Age of Onset  . Diabetes Father   . Diabetes Other     SOCIAL HISTORY: History   Social History  . Marital Status: Single    Spouse Name: N/A    Number of Children: N/A  . Years of Education: N/A   Occupational History  . Not on file.   Social History Main Topics  . Smoking status: Former Smoker    Types: Cigarettes    Quit date: 04/08/2011  . Smokeless tobacco: Not on file  . Alcohol Use: Yes     Comment: occasional  . Drug Use: No  . Sexual Activity: No   Other Topics Concern  . Not on file   Social History Narrative  . No narrative on file    REVIEW OF SYSTEMS: Constitutional: No fevers, chills, or sweats, no generalized fatigue, change in appetite Eyes: No visual changes, double vision, eye pain Ear, nose and throat: No hearing loss, ear pain, nasal congestion, sore throat Cardiovascular: No chest pain, palpitations Respiratory:  No shortness of breath at rest or with exertion, wheezes  GastrointestinaI: No nausea, vomiting, diarrhea, abdominal pain, fecal incontinence Genitourinary:  No dysuria, urinary retention or frequency Musculoskeletal:  No neck pain, back pain Integumentary: No rash, pruritus, skin lesions Neurological: as above Psychiatric: No depression, insomnia, anxiety Endocrine: No palpitations, fatigue, diaphoresis, mood swings, change in appetite, change in weight, increased thirst Hematologic/Lymphatic:  No  anemia, purpura, petechiae. Allergic/Immunologic: no itchy/runny eyes, nasal congestion, recent allergic reactions, rashes  PHYSICAL EXAM: There were no vitals filed for this visit. General: No acute distress Head:  Normocephalic/atraumatic Neck: supple, no paraspinal tenderness, full range of motion Back: No paraspinal tenderness Heart: regular rate and rhythm Lungs: Clear to auscultation bilaterally. Vascular: No carotid bruits. Neurological Exam: Mental status: alert and oriented to person, place, and time, recent and remote memory intact, fund of knowledge intact, attention and concentration intact, speech fluent and not dysarthric, language intact. Cranial nerves: CN I: not tested CN II: pupils equal, round and reactive to light, visual fields intact, fundi unremarkable, without vessel changes, exudates, hemorrhages or papilledema. CN III, IV, VI:  full range of motion, no nystagmus, no ptosis CN V: facial sensation intact CN VII: upper and lower face symmetric CN VIII: hearing intact CN IX, X: gag intact, uvula midline CN XI: sternocleidomastoid and trapezius muscles intact CN XII: tongue midline Bulk & Tone: normal, no fasciculations. Motor: 5 out of 5 throughout Sensation: Temperature and vibration intact Deep Tendon Reflexes: 2+ throughout, toes downgoing Finger to nose testing: No dysmetria Heel to shin: No dysmetria Gait: Normal station and stride. Able to turn and walk in tandem. Romberg negative.  IMPRESSION: New onset headache 1. Initial headache sounds like a primary stabbing headache. 2. Current headache sounds like tension-type headaches  PLAN: 1. Since these are new-onset headaches, and a diagnosis of the headache syndrome is a diagnosis of exclusion, she should have imaging of the brain, namely MRI of the brain with and without contrast and MRA of the head to look for any intracranial abnormality 2. May take NSAIDs for headache. Instructed to limit use of  pain relievers to no more than 2 days out of the week. 3. Do to increase frequency of headaches, will start a preventative medication. We'll start nortriptyline 10 mg at bedtime. Side effects discussed. 4. She is instructed to call in 4 weeks with an update and further changes to medications can be made. 5. Followup in 3 months. 6.  Should be set up to establish care with a PCP.  Thank you for allowing me to take part in the care of this patient.  Shon MilletAdam Jaffe, DO

## 2014-02-27 ENCOUNTER — Emergency Department (HOSPITAL_COMMUNITY): Payer: No Typology Code available for payment source

## 2014-02-27 ENCOUNTER — Emergency Department (HOSPITAL_COMMUNITY)
Admission: EM | Admit: 2014-02-27 | Discharge: 2014-02-27 | Disposition: A | Discharge status: Home / Self Care | Payer: No Typology Code available for payment source | Attending: Emergency Medicine | Admitting: Emergency Medicine

## 2014-02-27 ENCOUNTER — Encounter (HOSPITAL_COMMUNITY): Payer: Self-pay | Admitting: Emergency Medicine

## 2014-02-27 DIAGNOSIS — Y9389 Activity, other specified: Secondary | ICD-10-CM | POA: Insufficient documentation

## 2014-02-27 DIAGNOSIS — S298XXA Other specified injuries of thorax, initial encounter: Secondary | ICD-10-CM | POA: Diagnosis not present

## 2014-02-27 DIAGNOSIS — Y92410 Unspecified street and highway as the place of occurrence of the external cause: Secondary | ICD-10-CM | POA: Diagnosis not present

## 2014-02-27 DIAGNOSIS — Z87891 Personal history of nicotine dependence: Secondary | ICD-10-CM | POA: Insufficient documentation

## 2014-02-27 MED ORDER — ACETAMINOPHEN 500 MG PO TABS
1000.0000 mg | ORAL_TABLET | Freq: Once | ORAL | Status: AC
  Administered 2014-02-27: 1000 mg via ORAL
  Filled 2014-02-27: qty 2

## 2014-02-27 MED ORDER — HYDROCODONE-ACETAMINOPHEN 5-325 MG PO TABS: 1.0000 | ORAL_TABLET | Freq: Four times a day (QID) | ORAL | Status: DC | PRN

## 2014-02-27 NOTE — Progress Notes (Signed)
[facility] Community Liaison Stacy,  ° °Provided pt with a list of primary care resources and a [facility] [employer] Card application to help patient establish primary care.  °

## 2014-02-27 NOTE — ED Notes (Signed)
Patient was educated not to drive, operate heavy machinery, or drink alcohol while taking narcotic medication.  
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
## 2014-03-06 ENCOUNTER — Ambulatory Visit (HOSPITAL_COMMUNITY): Admission: RE | Admit: 2014-03-06 | Payer: Self-pay | Source: Ambulatory Visit

## 2014-03-11 ENCOUNTER — Ambulatory Visit (HOSPITAL_COMMUNITY)
Admission: RE | Admit: 2014-03-11 | Discharge: 2014-03-11 | Disposition: A | Discharge status: Home / Self Care | Payer: No Typology Code available for payment source | Source: Ambulatory Visit | Attending: Neurology | Admitting: Neurology

## 2014-03-11 DIAGNOSIS — R51 Headache: Secondary | ICD-10-CM | POA: Insufficient documentation

## 2014-03-11 DIAGNOSIS — G44219 Episodic tension-type headache, not intractable: Secondary | ICD-10-CM

## 2014-03-11 DIAGNOSIS — G4485 Primary stabbing headache: Secondary | ICD-10-CM

## 2014-03-11 DIAGNOSIS — R519 Headache, unspecified: Secondary | ICD-10-CM

## 2014-03-11 MED ORDER — GADOBENATE DIMEGLUMINE 529 MG/ML IV SOLN
20.0000 mL | Freq: Once | INTRAVENOUS | Status: AC | PRN
  Administered 2014-03-11: 20 mL via INTRAVENOUS

## 2014-03-12 ENCOUNTER — Telehealth: Payer: Self-pay | Admitting: *Deleted

## 2014-03-12 NOTE — Telephone Encounter (Signed)
Patient is aware of normal MRI MRA results

## 2014-03-12 NOTE — Telephone Encounter (Signed)
Message copied by Fredirick Maudlin on Thu Mar 12, 2014  8:04 AM ------      Message from: JAFFE, ADAM R      Created: Thu Mar 12, 2014  6:21 AM       MRI and MRA of the brain look okay.      ----- Message -----         From: Rad Results In Interface         Sent: 03/11/2014   9:09 PM           To: Cira Servant, DO                   ------

## 2014-05-04 ENCOUNTER — Encounter (HOSPITAL_COMMUNITY): Payer: Self-pay | Admitting: Emergency Medicine

## 2014-05-20 ENCOUNTER — Telehealth: Payer: Self-pay | Admitting: Neurology

## 2014-05-20 NOTE — Telephone Encounter (Signed)
Pt canceled her f/u appt on 05/22/14 due to her son having an appt that day. She will call later to r/s.

## 2014-05-22 ENCOUNTER — Ambulatory Visit: Payer: Self-pay | Admitting: Neurology

## 2014-09-15 ENCOUNTER — Emergency Department (HOSPITAL_COMMUNITY)
Admission: EM | Admit: 2014-09-15 | Discharge: 2014-09-15 | Disposition: A | Discharge status: Home / Self Care | Payer: No Typology Code available for payment source | Attending: Emergency Medicine | Admitting: Emergency Medicine

## 2014-09-15 ENCOUNTER — Encounter (HOSPITAL_COMMUNITY): Payer: Self-pay | Admitting: Emergency Medicine

## 2014-09-15 DIAGNOSIS — Y92410 Unspecified street and highway as the place of occurrence of the external cause: Secondary | ICD-10-CM | POA: Insufficient documentation

## 2014-09-15 DIAGNOSIS — Z87891 Personal history of nicotine dependence: Secondary | ICD-10-CM | POA: Diagnosis not present

## 2014-09-15 DIAGNOSIS — S29092A Other injury of muscle and tendon of back wall of thorax, initial encounter: Secondary | ICD-10-CM | POA: Insufficient documentation

## 2014-09-15 DIAGNOSIS — Y9389 Activity, other specified: Secondary | ICD-10-CM | POA: Insufficient documentation

## 2014-09-15 DIAGNOSIS — Y998 Other external cause status: Secondary | ICD-10-CM | POA: Diagnosis not present

## 2014-09-15 DIAGNOSIS — M5489 Other dorsalgia: Secondary | ICD-10-CM

## 2014-09-15 DIAGNOSIS — S3992XA Unspecified injury of lower back, initial encounter: Secondary | ICD-10-CM | POA: Insufficient documentation

## 2014-09-15 MED ORDER — NAPROXEN 500 MG PO TABS: 500.0000 mg | ORAL_TABLET | Freq: Two times a day (BID) | ORAL | Status: DC

## 2014-09-15 MED ORDER — METHOCARBAMOL 500 MG PO TABS: 500.0000 mg | ORAL_TABLET | Freq: Two times a day (BID) | ORAL | Status: DC

## 2014-09-15 MED ORDER — KETOROLAC TROMETHAMINE 60 MG/2ML IM SOLN
60.0000 mg | Freq: Once | INTRAMUSCULAR | Status: AC
  Administered 2014-09-15: 60 mg via INTRAMUSCULAR
  Filled 2014-09-15: qty 2

## 2014-09-15 NOTE — Discharge Instructions (Signed)
Back Pain, Adult Low back pain is very common. About 1 in 5 people have back pain.The cause of low back pain is rarely dangerous. The pain often gets better over time.About half of people with a sudden onset of back pain feel better in just 2 weeks. About 8 in 10 people feel better by 6 weeks.  CAUSES Some common causes of back pain include:  Strain of the muscles or ligaments supporting the spine.  Wear and tear (degeneration) of the spinal discs.  Arthritis.  Direct injury to the back. DIAGNOSIS Most of the time, the direct cause of low back pain is not known.However, back pain can be treated effectively even when the exact cause of the pain is unknown.Answering your caregiver's questions about your overall health and symptoms is one of the most accurate ways to make sure the cause of your pain is not dangerous. If your caregiver needs more information, he or she may order lab work or imaging tests (X-rays or MRIs).However, even if imaging tests show changes in your back, this usually does not require surgery. HOME CARE INSTRUCTIONS For many people, back pain returns.Since low back pain is rarely dangerous, it is often a condition that people can learn to manageon their own.   Remain active. It is stressful on the back to sit or stand in one place. Do not sit, drive, or stand in one place for more than 30 minutes at a time. Take short walks on level surfaces as soon as pain allows.Try to increase the length of time you walk each day.  Do not stay in bed.Resting more than 1 or 2 days can delay your recovery.  Do not avoid exercise or work.Your body is made to move.It is not dangerous to be active, even though your back may hurt.Your back will likely heal faster if you return to being active before your pain is gone.  Pay attention to your body when you bend and lift. Many people have less discomfortwhen lifting if they bend their knees, keep the load close to their bodies,and  avoid twisting. Often, the most comfortable positions are those that put less stress on your recovering back.  Find a comfortable position to sleep. Use a firm mattress and lie on your side with your knees slightly bent. If you lie on your back, put a pillow under your knees.  Only take over-the-counter or prescription medicines as directed by your caregiver. Over-the-counter medicines to reduce pain and inflammation are often the most helpful.Your caregiver may prescribe muscle relaxant drugs.These medicines help dull your pain so you can more quickly return to your normal activities and healthy exercise.  Put ice on the injured area.  Put ice in a plastic bag.  Place a towel between your skin and the bag.  Leave the ice on for 15-20 minutes, 03-04 times a day for the first 2 to 3 days. After that, ice and heat may be alternated to reduce pain and spasms.  Ask your caregiver about trying back exercises and gentle massage. This may be of some benefit.  Avoid feeling anxious or stressed.Stress increases muscle tension and can worsen back pain.It is important to recognize when you are anxious or stressed and learn ways to manage it.Exercise is a great option. SEEK MEDICAL CARE IF:  You have pain that is not relieved with rest or medicine.  You have pain that does not improve in 1 week.  You have new symptoms.  You are generally not feeling well. SEEK   IMMEDIATE MEDICAL CARE IF:   You have pain that radiates from your back into your legs.  You develop new bowel or bladder control problems.  You have unusual weakness or numbness in your arms or legs.  You develop nausea or vomiting.  You develop abdominal pain.  You feel faint. Document Released: 06/19/2005 Document Revised: 12/19/2011 Document Reviewed: 10/21/2013 ExitCare Patient Information 2015 [employer]. This information is not intended to replace advice given to you by your health care provider. Make sure you  discuss any questions you have with your health care provider.  

## 2014-09-15 NOTE — Progress Notes (Signed)
  CARE MANAGEMENT ED NOTE 09/15/2014  Patient:  Angel Ho,Angel Ho   Account Number:  [id number]  Date Initiated:  09/15/2014  Documentation initiated by:  Edd ArbourGIBBS,KIMBERLY  Subjective/Objective Assessment:   29 yr old self [place] and still with pain of back and left hip,  left knee     Subjective/Objective Assessment Detail:   no pcp listed Pt informed CM she has recent insurance coverage but [facility] at the time CM spoke with her  Pt pleasant and appreciative     Action/Plan:   see below information plus provided pt with self [place] resources   Action/Plan Detail:   Anticipated DC Date:  09/15/2014     Status Recommendation to Physician:   Result of Recommendation:    Other ED Services  Consult Working Plan    DC Planning Services  Other  Outpatient Services - Pt will follow up  PCP issues    Choice offered to / List presented to:            Status of service:  Completed, signed off  ED Comments:   ED Comments Detail:  [facility] ED CM spoke with pt on how to obtain an in network pcp with insurance coverage via the customer service number or web site Cm reviewed ED level of care for crisis/emergent services and community pcp level of care to manage continuous or chronic medical concerns.  The pt voiced understanding CM encouraged pt and discussed pt's responsibility to verify with pt's insurance carrier that any recommended medical provider offered by any emergency room or a hospital provider is within the carrier's network. The pt voiced understanding  CM spoke with pt who [place]. CM discussed and provided written information for self pay pcps, importance of pcp for f/u care, www.needymeds.org, www.goodrx.com, discounted pharmacies and other [place] resources such as [facility], [employer], affordable care act,  financial assistance, DSS and  health department  Reviewed  resources for [place] self pay pcps like [facility], [facility] [address], [facility], general medical clinics, [facility] plus others, medication resources, [facility] out patient pharmacies and housing Pt voiced understanding and appreciation of resources provided
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
## 2015-08-12 ENCOUNTER — Encounter (HOSPITAL_COMMUNITY): Payer: Self-pay | Admitting: *Deleted

## 2015-08-12 DIAGNOSIS — I2699 Other pulmonary embolism without acute cor pulmonale: Principal | ICD-10-CM | POA: Diagnosis present

## 2015-08-12 DIAGNOSIS — I82431 Acute embolism and thrombosis of right popliteal vein: Secondary | ICD-10-CM | POA: Diagnosis present

## 2015-08-12 DIAGNOSIS — Z87891 Personal history of nicotine dependence: Secondary | ICD-10-CM

## 2015-08-12 DIAGNOSIS — I82411 Acute embolism and thrombosis of right femoral vein: Secondary | ICD-10-CM | POA: Diagnosis present

## 2015-08-12 DIAGNOSIS — M79661 Pain in right lower leg: Secondary | ICD-10-CM | POA: Diagnosis not present

## 2015-08-12 DIAGNOSIS — I82441 Acute embolism and thrombosis of right tibial vein: Secondary | ICD-10-CM | POA: Diagnosis present

## 2015-08-12 NOTE — ED Notes (Signed)
Patient reports right calf pain that started yesterday, cramping sensation, was unable to bear weight, today able to bear weight but states leg is swollen and warm.

## 2015-08-13 ENCOUNTER — Encounter (HOSPITAL_COMMUNITY): Payer: Self-pay | Admitting: Radiology

## 2015-08-13 ENCOUNTER — Inpatient Hospital Stay (HOSPITAL_COMMUNITY)
Admission: EM | Admit: 2015-08-13 | Discharge: 2015-08-14 | DRG: 176 | Disposition: A | Discharge status: Home / Self Care | Payer: Managed Care, Other (non HMO) | Attending: Internal Medicine | Admitting: Internal Medicine

## 2015-08-13 ENCOUNTER — Emergency Department (HOSPITAL_COMMUNITY): Payer: Managed Care, Other (non HMO)

## 2015-08-13 ENCOUNTER — Inpatient Hospital Stay (HOSPITAL_COMMUNITY): Payer: Managed Care, Other (non HMO)

## 2015-08-13 DIAGNOSIS — M7989 Other specified soft tissue disorders: Secondary | ICD-10-CM | POA: Diagnosis not present

## 2015-08-13 DIAGNOSIS — I2699 Other pulmonary embolism without acute cor pulmonale: Secondary | ICD-10-CM | POA: Diagnosis present

## 2015-08-13 DIAGNOSIS — I82401 Acute embolism and thrombosis of unspecified deep veins of right lower extremity: Secondary | ICD-10-CM

## 2015-08-13 DIAGNOSIS — I82411 Acute embolism and thrombosis of right femoral vein: Secondary | ICD-10-CM | POA: Diagnosis present

## 2015-08-13 DIAGNOSIS — I82431 Acute embolism and thrombosis of right popliteal vein: Secondary | ICD-10-CM | POA: Diagnosis present

## 2015-08-13 DIAGNOSIS — M79661 Pain in right lower leg: Secondary | ICD-10-CM | POA: Diagnosis present

## 2015-08-13 DIAGNOSIS — I82441 Acute embolism and thrombosis of right tibial vein: Secondary | ICD-10-CM | POA: Diagnosis present

## 2015-08-13 DIAGNOSIS — Z87891 Personal history of nicotine dependence: Secondary | ICD-10-CM | POA: Diagnosis not present

## 2015-08-13 DIAGNOSIS — I2602 Saddle embolus of pulmonary artery with acute cor pulmonale: Secondary | ICD-10-CM

## 2015-08-13 HISTORY — DX: Other pulmonary embolism without acute cor pulmonale: I26.99

## 2015-08-13 LAB — HIV ANTIBODY (ROUTINE TESTING W REFLEX): HIV SCREEN 4TH GENERATION: NONREACTIVE

## 2015-08-13 LAB — CBC WITH DIFFERENTIAL/PLATELET
Basophils Absolute: 0 10*3/uL (ref 0.0–0.1)
Basophils Relative: 1 %
Eosinophils Absolute: 0.3 10*3/uL (ref 0.0–0.7)
Eosinophils Relative: 4 %
HEMATOCRIT: 34.1 % — AB (ref 36.0–46.0)
Hemoglobin: 11.4 g/dL — ABNORMAL LOW (ref 12.0–15.0)
LYMPHS ABS: 2.8 10*3/uL (ref 0.7–4.0)
LYMPHS PCT: 38 %
MCH: 28.1 pg (ref 26.0–34.0)
MCHC: 33.4 g/dL (ref 30.0–36.0)
MCV: 84.2 fL (ref 78.0–100.0)
MONOS PCT: 6 %
Monocytes Absolute: 0.5 10*3/uL (ref 0.1–1.0)
Neutro Abs: 3.7 10*3/uL (ref 1.7–7.7)
Neutrophils Relative %: 51 %
Platelets: 182 10*3/uL (ref 150–400)
RBC: 4.05 MIL/uL (ref 3.87–5.11)
RDW: 14.6 % (ref 11.5–15.5)
WBC: 7.2 10*3/uL (ref 4.0–10.5)

## 2015-08-13 LAB — APTT: APTT: 34 s (ref 24–37)

## 2015-08-13 LAB — CBC
HCT: 32.5 % — ABNORMAL LOW (ref 36.0–46.0)
Hemoglobin: 11 g/dL — ABNORMAL LOW (ref 12.0–15.0)
MCH: 28.4 pg (ref 26.0–34.0)
MCHC: 33.8 g/dL (ref 30.0–36.0)
MCV: 83.8 fL (ref 78.0–100.0)
PLATELETS: 174 10*3/uL (ref 150–400)
RBC: 3.88 MIL/uL (ref 3.87–5.11)
RDW: 14.6 % (ref 11.5–15.5)
WBC: 7.5 10*3/uL (ref 4.0–10.5)

## 2015-08-13 LAB — I-STAT CHEM 8, ED
BUN: 7 mg/dL (ref 6–20)
CREATININE: 0.8 mg/dL (ref 0.44–1.00)
Calcium, Ion: 1.18 mmol/L (ref 1.12–1.23)
Chloride: 103 mmol/L (ref 101–111)
GLUCOSE: 84 mg/dL (ref 65–99)
HCT: 40 % (ref 36.0–46.0)
HEMOGLOBIN: 13.6 g/dL (ref 12.0–15.0)
POTASSIUM: 3.8 mmol/L (ref 3.5–5.1)
Sodium: 140 mmol/L (ref 135–145)
TCO2: 26 mmol/L (ref 0–100)

## 2015-08-13 LAB — COMPREHENSIVE METABOLIC PANEL
ALK PHOS: 61 U/L (ref 38–126)
ALT: 16 U/L (ref 14–54)
AST: 17 U/L (ref 15–41)
Albumin: 2.7 g/dL — ABNORMAL LOW (ref 3.5–5.0)
Anion gap: 9 (ref 5–15)
BILIRUBIN TOTAL: 0.3 mg/dL (ref 0.3–1.2)
BUN: 5 mg/dL — AB (ref 6–20)
CALCIUM: 8.6 mg/dL — AB (ref 8.9–10.3)
CO2: 24 mmol/L (ref 22–32)
CREATININE: 0.74 mg/dL (ref 0.44–1.00)
Chloride: 107 mmol/L (ref 101–111)
Glucose, Bld: 95 mg/dL (ref 65–99)
Potassium: 3.7 mmol/L (ref 3.5–5.1)
Sodium: 140 mmol/L (ref 135–145)
Total Protein: 6.2 g/dL — ABNORMAL LOW (ref 6.5–8.1)

## 2015-08-13 LAB — TROPONIN I
Troponin I: <0.03 ng/mL (ref <0.031)
Troponin I: <0.03 ng/mL (ref <0.031)

## 2015-08-13 LAB — PROTIME-INR
INR: 1.08 (ref 0.00–1.49)
PROTHROMBIN TIME: 14.2 s (ref 11.6–15.2)

## 2015-08-13 LAB — SEDIMENTATION RATE: SED RATE: 27 mm/h — AB (ref 0–22)

## 2015-08-13 LAB — BRAIN NATRIURETIC PEPTIDE: B Natriuretic Peptide: 34.3 pg/mL (ref 0.0–100.0)

## 2015-08-13 LAB — MRSA PCR SCREENING: MRSA by PCR: NEGATIVE

## 2015-08-13 LAB — C-REACTIVE PROTEIN: CRP: 2.7 mg/dL — ABNORMAL HIGH (ref <1.0)

## 2015-08-13 LAB — ANTITHROMBIN III: AntiThromb III Func: 93 % (ref 75–120)

## 2015-08-13 MED ORDER — ACETAMINOPHEN 650 MG RE SUPP: 650.0000 mg | Freq: Four times a day (QID) | RECTAL | Status: DC | PRN

## 2015-08-13 MED ORDER — ONDANSETRON HCL 4 MG/2ML IJ SOLN: 4.0000 mg | Freq: Four times a day (QID) | INTRAMUSCULAR | Status: DC | PRN

## 2015-08-13 MED ORDER — SODIUM CHLORIDE 0.9 % IV BOLUS (SEPSIS)
1000.0000 mL | Freq: Once | INTRAVENOUS | Status: AC
  Administered 2015-08-13: 1000 mL via INTRAVENOUS

## 2015-08-13 MED ORDER — OXYCODONE-ACETAMINOPHEN 5-325 MG PO TABS: 2.0000 | ORAL_TABLET | Freq: Four times a day (QID) | ORAL | Status: DC | PRN

## 2015-08-13 MED ORDER — SODIUM CHLORIDE 0.9% FLUSH
3.0000 mL | Freq: Two times a day (BID) | INTRAVENOUS | Status: DC
  Administered 2015-08-13 – 2015-08-14 (×4): 3 mL via INTRAVENOUS

## 2015-08-13 MED ORDER — RIVAROXABAN 15 MG PO TABS
15.0000 mg | ORAL_TABLET | Freq: Two times a day (BID) | ORAL | Status: DC
  Administered 2015-08-13 – 2015-08-14 (×2): 15 mg via ORAL
  Filled 2015-08-13 (×2): qty 1

## 2015-08-13 MED ORDER — ONDANSETRON HCL 4 MG PO TABS: 4.0000 mg | ORAL_TABLET | Freq: Four times a day (QID) | ORAL | Status: DC | PRN

## 2015-08-13 MED ORDER — MORPHINE SULFATE (PF) 2 MG/ML IV SOLN: 2.0000 mg | INTRAVENOUS | Status: DC | PRN

## 2015-08-13 MED ORDER — SODIUM CHLORIDE 0.9 % IV SOLN
INTRAVENOUS | Status: DC
  Administered 2015-08-13: 03:00:00 via INTRAVENOUS

## 2015-08-13 MED ORDER — HEPARIN (PORCINE) IN NACL 100-0.45 UNIT/ML-% IJ SOLN
1100.0000 [IU]/h | INTRAMUSCULAR | Status: AC
  Administered 2015-08-13: 1100 [IU]/h via INTRAVENOUS
  Filled 2015-08-13: qty 250

## 2015-08-13 MED ORDER — RIVAROXABAN 20 MG PO TABS
20.0000 mg | ORAL_TABLET | Freq: Every day | ORAL | Status: DC
Start: 2015-09-04 — End: 2015-08-14

## 2015-08-13 MED ORDER — ALUM & MAG HYDROXIDE-SIMETH 200-200-20 MG/5ML PO SUSP: 30.0000 mL | Freq: Four times a day (QID) | ORAL | Status: DC | PRN

## 2015-08-13 MED ORDER — ACETAMINOPHEN 325 MG PO TABS: 650.0000 mg | ORAL_TABLET | Freq: Four times a day (QID) | ORAL | Status: DC | PRN

## 2015-08-13 MED ORDER — ENOXAPARIN SODIUM 100 MG/ML ~~LOC~~ SOLN
1.0000 mg/kg | Freq: Once | SUBCUTANEOUS | Status: AC
  Administered 2015-08-13: 100 mg via SUBCUTANEOUS
  Filled 2015-08-13: qty 1

## 2015-08-13 MED ORDER — IOHEXOL 350 MG/ML SOLN
80.0000 mL | Freq: Once | INTRAVENOUS | Status: AC | PRN
Start: 2015-08-13 — End: 2015-08-13
  Administered 2015-08-13: 100 mL via INTRAVENOUS

## 2015-08-13 NOTE — H&P (Signed)
[facility] History and Physical  Kamorie Aldous MVE:[id number] DOB: 07-27-1985 DOA: 08/13/2015  Referring physician: ED physician PCP: No PCP Per Patient  Specialists:   Chief Complaint: Right calf pain, right leg swelling and chest pain  HPI: Angel Ho is a 30 y.o. female withot significant PMH, who presents with right calf pain, right leg swelling and chest pain.  Patient reports that he had one episode of chest pain on Tuesday which resolved spontaneously. After that, she had mild chest tightness, lasted for about 1 day, then resolved spontaneously. Currently no chest pain. Since yesterday, she started having right calf pain and right leg swelling. She does not have fever, chills, shortness breath, cough, abdominal pain, diarrhea, symptoms of UTI or unilateral weakness. She did not have long distance traveling recently. No family history of blood clot. Of note, she has been taking birth control pills for 3 years.  In ED, patient was found to have WBC 7.2, temperature normal, blood pressure normal, no tachycardia, electrolytes and renal function okay. CTA of chest showed pulmonary embolus within pulmonary arteries extending to all lobes of both lungs with right heart screening, also showed 1.7 cm azygoesophageal recess of uncertain significance.  EKG: Not done in ED, will get one.   Where does patient live?   At home  Can patient participate in ADLs?  Yes  Review of Systems:   General: no fevers, chills, no changes in body weight, has fatigue HEENT: no blurry vision, hearing changes or sore throat Pulm: no dyspnea, coughing, wheezing CV: had chest pain, no palpitations Abd: no nausea, vomiting, abdominal pain, diarrhea, constipation GU: no dysuria, burning on urination, increased urinary frequency, hematuria  Ext: has right leg edema and right calf pain Neuro: no unilateral weakness, numbness, or tingling, no vision change or hearing loss Skin: no rash MSK: No  muscle spasm, no deformity, no limitation of range of movement in spin Heme: No easy bruising.  Travel history: No recent long distant travel.  Allergy: No Known Allergies  Past Medical History  Diagnosis Date  . No pertinent past medical history     Past Surgical History  Procedure Laterality Date  . No past surgeries      Social History:  reports that she quit smoking about 4 years ago. Her smoking use included Cigarettes. She does not have any smokeless tobacco history on file. She reports that she drinks alcohol. She reports that she does not use illicit drugs.  Family History:  Family History  Problem Relation Age of Onset  . Diabetes Father   . Diabetes Other      Prior to Admission medications   Medication Sig Start Date End Date Taking? Authorizing Provider  TRI-PREVIFEM 0.18/0.215/0.25 MG-35 MCG tablet Take 1 tablet by mouth daily. 07/23/15  Yes Historical Provider, MD  HYDROcodone-acetaminophen (NORCO) 5-325 MG per tablet Take 1-2 tablets by mouth every 6 (six) hours as needed for severe pain. Patient not taking: Reported on 08/13/2015 02/27/14   Orlie Dakin, MD  methocarbamol (ROBAXIN) 500 MG tablet Take 1 tablet (500 mg total) by mouth 2 (two) times daily. Patient not taking: Reported on 08/13/2015 09/15/14   Comer Locket, PA-C  naproxen (NAPROSYN) 500 MG tablet Take 1 tablet (500 mg total) by mouth 2 (two) times daily. Patient not taking: Reported on 08/13/2015 09/15/14   Comer Locket, PA-C    Physical Exam: Filed Vitals:   08/13/15 0151 08/13/15 0200 08/13/15 0245 08/13/15 0300  BP: 127/87 116/70 120/76 115/85  Pulse: 88 71  90 96  Temp:      TempSrc:      Resp: _0 Height:      Weight:      SpO2: 100% 100% 99% 99%   General: Not in acute distress HEENT:       Eyes: PERRL, EOMI, no scleral icterus.       ENT: No discharge from the ears and nose, no pharynx injection, no tonsillar enlargement.        Neck: No JVD, no bruit, no mass  felt. Heme: No neck lymph node enlargement. Cardiac: S1/S2, RRR, No murmurs, No gallops or rubs. Pulm: No rales, wheezing, rhonchi or rubs. Abd: Soft, nondistended, nontender, no rebound pain, no organomegaly, BS present. Ext: right leg is swelling and tender over calf area. 2+DP/PT pulse bilaterally. Musculoskeletal: No joint deformities, No joint redness or warmth, no limitation of ROM in spin. Skin: No rashes.  Neuro: Alert, oriented X3, cranial nerves II-XII grossly intact, moves all extremities normally Psych: Patient is not psychotic, no suicidal or hemocidal ideation.  Labs on Admission:  Basic Metabolic Panel:  Recent Labs Lab 08/13/15 0139  NA 140  K 3.8  CL 103  GLUCOSE 84  BUN 7  CREATININE 0.80   Liver Function Tests: No results for input(s): AST, ALT, ALKPHOS, BILITOT, PROT, ALBUMIN in the last 168 hours. No results for input(s): LIPASE, AMYLASE in the last 168 hours. No results for input(s): AMMONIA in the last 168 hours. CBC:  Recent Labs Lab 08/13/15 0120 08/13/15 0139  WBC 7.2  --   NEUTROABS 3.7  --   HGB 11.4* 13.6  HCT 34.1* 40.0  MCV 84.2  --   PLT 182  --    Cardiac Enzymes: No results for input(s): CKTOTAL, CKMB, CKMBINDEX, TROPONINI in the last 168 hours.  BNP (last 3 results) No results for input(s): BNP in the last 8760 hours.  ProBNP (last 3 results) No results for input(s): PROBNP in the last 8760 hours.  CBG: No results for input(s): GLUCAP in the last 168 hours.  Radiological Exams on Admission: Ct Angio Chest Pe W/cm &/or Wo Cm  08/13/2015  CLINICAL DATA:  Acute onset of moderate cramping and swelling at the right calf. Mild generalized chest pain and shortness of breath. Initial encounter. Initial encounter. EXAM: CT ANGIOGRAPHY CHEST WITH CONTRAST TECHNIQUE: Multidetector CT imaging of the chest was performed using the standard protocol during bolus administration of intravenous contrast. Multiplanar CT image reconstructions and  MIPs were obtained to evaluate the vascular anatomy. CONTRAST:  127m OMNIPAQUE IOHEXOL 350 MG/ML SOLN COMPARISON:  Chest and left rib radiographs performed 02/27/2014 FINDINGS: Pulmonary embolus is noted within pulmonary arteries extending to all lobes of both lungs, more prominent on the right. The RV/LV ratio of 1.1 is consistent with at least submassive pulmonary embolus. The lungs are essentially clear bilaterally There is no evidence of significant focal consolidation, pleural effusion or pneumothorax. No masses are identified; no abnormal focal contrast enhancement is seen. A 1.7 cm azygoesophageal recess node is seen. Remaining visualized nodes are normal in size. No pericardial effusion is identified. The great vessels are grossly unremarkable in appearance. No axillary lymphadenopathy is seen. The visualized portions of the thyroid gland are unremarkable in appearance. The visualized portions of the liver and spleen are unremarkable. The visualized portions of the pancreas, gallbladder, stomach, adrenal glands and kidneys are within normal limits. No acute osseous abnormalities are seen. Review of the MIP images confirms the above findings.  IMPRESSION: 1. Pulmonary embolus noted within pulmonary arteries extending to all lobes of both lungs, more prominent on the right. Positive for acute PE with CT evidence of right heart strain (RV/LV Ratio = 1.1) consistent with at least submassive (intermediate risk) PE. The presence of right heart strain has been associated with an increased risk of morbidity and mortality. Please activate Code PE by paging [phone]. 2. Lungs essentially clear bilaterally. 3. 1.7 cm azygoesophageal recess node seen, of uncertain significance. Critical Value/emergent results were called by telephone at the time of interpretation on 08/13/2015 at 2:20 am to Dr. Everlene Balls, who verbally acknowledged these results. Electronically Signed   By: Garald Balding M.D.   On: 08/13/2015 02:25     Assessment/Plan Principal Problem:   PE (pulmonary embolism) Active Problems:   Right leg swelling   Right calf pain   Principal Problem:   PE (pulmonary embolism) Active Problems:   Right leg swelling   Right calf pain  Pulmonary Embolus: Patient has bilateral PE and right heart straining by CTA-chest. Currently hemodynamically stable. No family history of blood clot. She has been taking oral contraceptive pills for 3 years, which likely increased the risk of getting blood clot.  -admit to stepdown for close monitoring overnight -heparin drip initiated by EDP (received one dose of lovenox in ED) -2D echocardiogram ordered -LE dopplers ordered to evaluate for DVT -repeat EKG in a.m. -trop x 3 -Hypercoag panel -pain control: When necessary Percocet and morphine -Hypercoagulable panel, CRP, ESR, ANA -HIV antibody -d/c OCP  Right leg swelling and calf pain: likely due to DVT -f/u LE venous doppler  DVT ppx: On IV Heparin    Code Status: Full code Family Communication:  Yes, patient's husband at bed side Disposition Plan: Admit to inpatient   Date of Service 08/13/2015    Ivor Costa [facility] Pager [phone]  If 7PM-7AM, please contact night-coverage [email or web address] Password [email or web address] 08/13/2015, 3:19 AM

## 2015-08-13 NOTE — Progress Notes (Signed)
UR Completed Camdyn Laden Graves-Bigelow, RN,BSN [phone]  

## 2015-08-13 NOTE — ED Notes (Signed)
Attempted to call report

## 2015-08-13 NOTE — Progress Notes (Signed)
VASCULAR LAB PRELIMINARY  PRELIMINARY  PRELIMINARY  PRELIMINARY  Bilateral lower extremity venous duplex  completed.    Preliminary report:  Right:  DVT of indeterminate age noted in the mid to distal FV, Pop v, and PTV.  Acute DVT in the gastrocnemius veins.  No evidence of superficial thrombosis.  No Baker's cyst.  Left:  No evidence of DVT, superficial thrombosis, or Baker's cyst.  Ramie Erman, RVT 08/13/2015, 12:51 PM

## 2015-08-13 NOTE — Progress Notes (Signed)
PATIENT DETAILS Name: Angel Ho Age: 30 y.o. Sex: female Date of Birth: January 01, 1986 Admit Date: 08/13/2015 Admitting Physician Lorretta Harp, MD PCP:No PCP Per Patient  Subjective: Denies any chest pain or shortness of breath. Right thigh swelling slightly better.  Assessment/Plan: Principal Problem: PE (pulmonary embolism): Although right heart strain seen on CT chest, no signs of RV strain on exam-no JVD/edema. She is hemodynamically stable, and basically asymptomatic without chest pain or shortness of breath at this time. 2-D echocardiogram does not show any significant right ventricular dilatation as well. Spoke with Dr. Hillis Range score -Class 1-does not recommend catheter guided thrombolytic treatment. Stop heparin, start Xarelto. Long discussion with both patient and mother at bedside-explained importance of stopping oral contraceptives. Observe overnight, if continues to do well, suspect could be discharged home tomorrow morning. Family/patient aware that patient will need close outpatient follow-up with PCP, and hematology follow-up in the next few months prior to discontinuation of anticoagulation.  Active Problems: Right lower extremity DVT: As above.  Disposition: Remain inpatient  Antimicrobial agents  See below  Anti-infectives    None      DVT Prophylaxis: Xarelto  Code Status: Full code   Family Communication Mother at bedside  Procedures: NOne  CONSULTS:  None  Time spent 30 minutes-Greater than 50% of this time was spent in counseling, explanation of diagnosis, planning of further management, and coordination of care.  MEDICATIONS: Scheduled Meds: . Rivaroxaban  15 mg Oral BID WC  . [START ON 09/04/2015] rivaroxaban  20 mg Oral Q supper  . sodium chloride flush  3 mL Intravenous Q12H   Continuous Infusions: . heparin 1,100 Units/hr (08/13/15 1102)   PRN Meds:.acetaminophen **OR** acetaminophen, alum & mag  hydroxide-simeth, morphine injection, ondansetron **OR** ondansetron (ZOFRAN) IV, oxyCODONE-acetaminophen    PHYSICAL EXAM: Vital signs in last 24 hours: Filed Vitals:   08/13/15 0315 08/13/15 0407 08/13/15 0730 08/13/15 1150  BP: 124/77  130/91 120/74  Pulse: 101 93 80 94  Temp:  98.2 F (36.8 C) 98.6 F (37 C)   TempSrc:  Oral Oral   Resp: Height:   (1.575 m)    Weight:  98.113 kg (216 lb 4.8 oz)    SpO2: 99% 99% 100% 99%    Weight change:  Filed Weights   08/12/15 2219 08/13/15 0407  Weight: 97.722 kg (215 lb 7 oz) 98.113 kg (216 lb 4.8 oz)   Body mass index is 39.55 kg/(m^2).   Gen Exam: Awake and alert with clear speech.   Neck: Supple, No JVD.   Chest: B/L Clear.   CVS: S1 S2 Regular, no murmurs.  Abdomen: soft, BS +, non tender, non distended.  Extremities: no edema, lower extremities warm to touch. Neurologic: Non Focal.   Skin: No Rash.   Wounds: N/A.    Intake/Output from previous day:  Intake/Output Summary (Last 24 hours) at 08/13/15 1424 Last data filed at 08/13/15 1000  Gross per 24 hour  Intake   1240 ml  Output      0 ml  Net   1240 ml     LAB RESULTS: CBC  Recent Labs Lab 08/13/15 0120 08/13/15 0139 08/13/15 0407  WBC 7.2  --  7.5  HGB 11.4* 13.6 11.0*  HCT 34.1* 40.0 32.5*  PLT 182  --  174  MCV 84.2  --  83.8  MCH 28.1  --  28.4  MCHC 33.4  --  33.8  RDW 14.6  --  14.6  LYMPHSABS 2.8  --   --   MONOABS 0.5  --   --   EOSABS 0.3  --   --   BASOSABS 0.0  --   --     Chemistries   Recent Labs Lab 08/13/15 0139 08/13/15 0407  NA 140 140  K 3.8 3.7  CL 103 107  CO2  --  24  GLUCOSE 84 95  BUN 7 5*  CREATININE 0.80 0.74  CALCIUM  --  8.6*    CBG: No results for input(s): GLUCAP in the last 168 hours.  GFR Estimated Creatinine Clearance: 114.5 mL/min (by C-G formula based on Cr of 0.74).  Coagulation profile  Recent Labs Lab 08/13/15 0408  INR 1.08    Cardiac Enzymes  Recent Labs Lab  08/13/15 0408 08/13/15 0958  TROPONINI <0.03 <0.03    Invalid input(s): POCBNP No results for input(s): DDIMER in the last 72 hours. No results for input(s): HGBA1C in the last 72 hours. No results for input(s): CHOL, HDL, LDLCALC, TRIG, CHOLHDL, LDLDIRECT in the last 72 hours. No results for input(s): TSH, T4TOTAL, T3FREE, THYROIDAB in the last 72 hours.  Invalid input(s): FREET3 No results for input(s): VITAMINB12, FOLATE, FERRITIN, TIBC, IRON, RETICCTPCT in the last 72 hours. No results for input(s): LIPASE, AMYLASE in the last 72 hours.  Urine Studies No results for input(s): UHGB, CRYS in the last 72 hours.  Invalid input(s): UACOL, UAPR, USPG, UPH, UTP, UGL, UKET, UBIL, UNIT, UROB, ULEU, UEPI, UWBC, URBC, UBAC, CAST, UCOM, BILUA  MICROBIOLOGY: Recent Results (from the past 240 hour(s))  MRSA PCR Screening     Status: None   Collection Time: 08/13/15  4:12 AM  Result Value Ref Range Status   MRSA by PCR NEGATIVE NEGATIVE Final    Comment:        The GeneXpert MRSA Assay (FDA approved for NASAL specimens only), is one component of a comprehensive MRSA colonization surveillance program. It is not intended to diagnose MRSA infection nor to guide or monitor treatment for MRSA infections.     RADIOLOGY STUDIES/RESULTS: Ct Angio Chest Pe W/cm &/or Wo Cm  08/13/2015  CLINICAL DATA:  Acute onset of moderate cramping and swelling at the right calf. Mild generalized chest pain and shortness of breath. Initial encounter. Initial encounter. EXAM: CT ANGIOGRAPHY CHEST WITH CONTRAST TECHNIQUE: Multidetector CT imaging of the chest was performed using the standard protocol during bolus administration of intravenous contrast. Multiplanar CT image reconstructions and MIPs were obtained to evaluate the vascular anatomy. CONTRAST:  OMNIPAQUE IOHEXOL 350 MG/ML SOLN COMPARISON:  Chest and left rib radiographs performed 02/27/2014 FINDINGS: Pulmonary embolus is noted within pulmonary  arteries extending to all lobes of both lungs, more prominent on the right. The RV/LV ratio of 1.1 is consistent with at least submassive pulmonary embolus. The lungs are essentially clear bilaterally There is no evidence of significant focal consolidation, pleural effusion or pneumothorax. No masses are identified; no abnormal focal contrast enhancement is seen. A 1.7 cm azygoesophageal recess node is seen. Remaining visualized nodes are normal in size. No pericardial effusion is identified. The great vessels are grossly unremarkable in appearance. No axillary lymphadenopathy is seen. The visualized portions of the thyroid gland are unremarkable in appearance. The visualized portions of the liver and spleen are unremarkable. The visualized portions of the pancreas, gallbladder, stomach, adrenal glands and kidneys are within normal limits. No acute osseous abnormalities  are seen. Review of the MIP images confirms the above findings. IMPRESSION: 1. Pulmonary embolus noted within pulmonary arteries extending to all lobes of both lungs, more prominent on the right. Positive for acute PE with CT evidence of right heart strain (RV/LV Ratio = 1.1) consistent with at least submassive (intermediate risk) PE. The presence of right heart strain has been associated with an increased risk of morbidity and mortality. Please activate Code PE by paging [phone]. 2. Lungs essentially clear bilaterally. 3. 1.7 cm azygoesophageal recess node seen, of uncertain significance. Critical Value/emergent results were called by telephone at the time of interpretation on 08/13/2015 at 2:20 am to Dr. Tomasita Crumble, who verbally acknowledged these results. Electronically Signed   By: Roanna Raider M.D.   On: 08/13/2015 02:25    Jeoffrey Massed, MD  [facility] Pager:[phone]  If 7PM-7AM, please contact night-coverage [email or web address] Password [email or web address] 08/13/2015, 2:24 PM   LOS: 0 days

## 2015-08-13 NOTE — Plan of Care (Signed)
Problem: Safety: Goal: Ability to remain free from injury will improve Outcome: Progressing Encourage pt to call prn needs, assist oob.  Problem: Pain Managment: Goal: General experience of comfort will improve Outcome: Progressing Assess Pain routine with VS and PRN  Problem: Tissue Perfusion: Goal: Risk factors for ineffective tissue perfusion will decrease Outcome: Progressing Assess Pedal pulses Bilateral routine with assessments and prn c/o other s/s pain, weakness, numbness, tingling or other.

## 2015-08-13 NOTE — Plan of Care (Signed)
Problem: Consults Goal: Venous Thromboembolism Patient Education See Patient Education Module for education specifics.  Outcome: Completed/Met Date Met:  08/13/15 Patient has received pharmacy education regarding Xarelto for PE/DVT earlier today. Received [facility] card in [place]; re-ordered from pharmacy in [place].  Currently pain/symptom free. Excellent O2 saturations on room air. Normal sinus rhythm with rates 80-90; no overt signs of right heart failure. Filed Vitals:    08/13/15 0407 08/13/15 0730 08/13/15 1150 08/13/15 1630  BP:   130/91 120/74 128/92  Pulse: 93 80 94 88  Temp: 98.2 F (36.8 C) 98.6 F (37 C)   98.5 F (36.9 C)  TempSrc: Oral Oral   Oral  Resp: 18 17 19 17   Height: 5' 2"  (1.575 m)        Weight: 98.113 kg (216 lb 4.8 oz)        SpO2: 99% 100% 99% 99%    Educated patient at bedside re:  Plan of care (PE/DVT)  Tests/procedures/labs (reviewed results)  My Chart access  Medications  Xarelto  No NSAID use  Personal risk factors for increased clotting tendency  Lab results (may need hematology consultation outpatient)  Oral contraceptives (stoppage)  Smoking cessation  Diet and exercise  Activity progression  Need to call RN for symptoms  Initial discharge plan  Progressing on-target for discharge in AM. Will need to mobilize patient.  Continuing to monitor. Goal: Diagnosis - Venous Thromboembolism (VTE) Choose a selection  Outcome: Completed/Met Date Met:  08/13/15 PE (Pulmonary Embolism) with right lower extremity DVTs.

## 2015-08-13 NOTE — Progress Notes (Signed)
PT Cancellation Note  Patient Details Name: Angel Ho MRN: [id number] DOB: 03/24/1986   Cancelled Treatment:    Reason Eval/Treat Not Completed: Medical issues which prohibited therapy.  CTA of chest showed PE.  Will await initiation of heparin therapy.  PT will continue to follow acutely.  Michail Jewels PT, DPT [phone] Pager: [phone] 08/13/2015, 8:59 AM

## 2015-08-13 NOTE — Discharge Instructions (Addendum)
Information on my medicine - XARELTO (rivaroxaban)  This medication education was reviewed with me or my healthcare representative as part of my discharge preparation.  The pharmacist that spoke with me during my hospital stay was:  Sampson Si, [facility]  WHY WAS XARELTO PRESCRIBED FOR YOU? Xarelto was prescribed to treat blood clots that may have been found in the veins of your legs (deep vein thrombosis) or in your lungs (pulmonary embolism) and to reduce the risk of them occurring again.  What do you need to know about Xarelto? The starting dose is one 15 mg tablet taken TWICE daily with food for the FIRST 21 DAYS then on 09/04/15  the dose is changed to one 20 mg tablet taken ONCE A DAY with your evening meal.  DO NOT stop taking Xarelto without talking to the health care provider who prescribed the medication.  Refill your prescription for 20 mg tablets before you run out.  After discharge, you should have regular check-up appointments with your healthcare provider that is prescribing your Xarelto.  In the future your dose may need to be changed if your kidney function changes by a significant amount.  What do you do if you miss a dose? If you are taking Xarelto TWICE DAILY and you miss a dose, take it as soon as you remember. You may take two 15 mg tablets (total 30 mg) at the same time then resume your regularly scheduled 15 mg twice daily the next day.  If you are taking Xarelto ONCE DAILY and you miss a dose, take it as soon as you remember on the same day then continue your regularly scheduled once daily regimen the next day. Do not take two doses of Xarelto at the same time.   Important Safety Information Xarelto is a blood thinner medicine that can cause bleeding. You should call your healthcare provider right away if you experience any of the following: ? Bleeding from an injury or your nose that does not stop. ? Unusual colored urine (red or dark brown) or unusual  colored stools (red or black). ? Unusual bruising for unknown reasons. ? A serious fall or if you hit your head (even if there is no bleeding).  Some medicines may interact with Xarelto and might increase your risk of bleeding while on Xarelto. To help avoid this, consult your healthcare provider or pharmacist prior to using any new prescription or non-prescription medications, including herbals, vitamins, non-steroidal anti-inflammatory drugs (NSAIDs) and supplements.  This website has more information on Xarelto: VisitDestination.com.br.   Deep Vein Thrombosis A deep vein thrombosis (DVT) is a blood clot (thrombus) that usually occurs in a deep, larger vein of the lower leg or the pelvis, or in an upper extremity such as the arm. These are dangerous and can lead to serious and even life-threatening complications if the clot travels to the lungs. A DVT can damage the valves in your leg veins so that instead of flowing upward, the blood pools in the lower leg. This is called post-thrombotic syndrome, and it can result in pain, swelling, discoloration, and sores on the leg. CAUSES A DVT is caused by the formation of a blood clot in your leg, pelvis, or arm. Usually, several things contribute to the formation of blood clots. A clot may develop when:  Your blood flow slows down.  Your vein becomes damaged in some way.  You have a condition that makes your blood clot more easily. RISK FACTORS A DVT is more likely  to develop in:  People who are older, especially over 51 years of age.  People who are overweight (obese).  People who sit or lie still for a long time, such as during long-distance travel (over 4 hours), bed rest, hospitalization, or during recovery from certain medical conditions like a stroke.  People who do not engage in much physical activity (sedentary lifestyle).  People who have chronic breathing disorders.  People who have a personal or family history of blood clots or blood  clotting disease.  People who have peripheral vascular disease (PVD), diabetes, or some types of cancer.  People who have heart disease, especially if the person had a recent heart attack or has congestive heart failure.  People who have neurological diseases that affect the legs (leg paresis).  People who have had a traumatic injury, such as breaking a hip or leg.  People who have recently had major or lengthy surgery, especially on the hip, knee, or abdomen.  People who have had a central line placed inside a large vein.  People who take medicines that contain the hormone estrogen. These include birth control pills and hormone replacement therapy.  Pregnancy or during childbirth or the postpartum period.  Long plane flights (over 8 hours). SIGNS AND SYMPTOMS Symptoms of a DVT can include:   Swelling of your leg or arm, especially if one side is much worse.  Warmth and redness of your leg or arm, especially if one side is much worse.  Pain in your arm or leg. If the clot is in your leg, symptoms may be more noticeable or worse when you stand or walk.  A feeling of pins and needles, if the clot is in the arm. The symptoms of a DVT that has traveled to the lungs (pulmonary embolism, PE) usually start suddenly and include:  Shortness of breath while active or at rest.  Coughing or coughing up blood or blood-tinged mucus.  Chest pain that is often worse with deep breaths.  Rapid or irregular heartbeat.  Feeling light-headed or dizzy.  Fainting.  Feeling anxious.  Sweating. There may also be pain and swelling in a leg if that is where the blood clot started. These symptoms may represent a serious problem that is an emergency. Do not wait to see if the symptoms will go away. Get medical help right away. Call your local emergency services (911 in the [place] Do not drive yourself to the hospital. DIAGNOSIS Your health care provider will take a medical history and perform a  physical exam. You may also have other tests, including:  Blood tests to assess the clotting properties of your blood.  Imaging tests, such as CT, ultrasound, MRI, X-ray, and other tests to see if you have clots anywhere in your body. TREATMENT After a DVT is identified, it can be treated. The type of treatment that you receive depends on many factors, such as the cause of your DVT, your risk for bleeding or developing more clots, and other medical conditions that you have. Sometimes, a combination of treatments is necessary. Treatment options may be combined and include:  Monitoring the blood clot with ultrasound.  Taking medicines by mouth, such as newer blood thinners (anticoagulants), thrombolytics, or warfarin.  Taking anticoagulant medicine by injection or through an IV tube.  Wearing compression stockings or using different types ofdevices.  Surgery (rare) to remove the blood clot or to place a filter in your abdomen to stop the blood clot from traveling to your lungs. Treatments  for a DVT are often divided into immediate treatment and long-term treatment (up to 3 months after DVT). You can work with your health care provider to choose the treatment program that is best for you. HOME CARE INSTRUCTIONS If you are taking a newer oral anticoagulant:  Take the medicine every single day at the same time each day.  Understand what foods and drugs interact with this medicine.  Understand that there are no regular blood tests required when using this medicine.  Understand the side effects of this medicine, including excessive bruising or bleeding. Ask your health care provider or pharmacist about other possible side effects. If you are taking warfarin:  Understand how to take warfarin and know which foods can affect how warfarin works in [employer].  Understand that it is dangerous to take too much or too little warfarin. Too much warfarin increases the risk of bleeding. Too little  warfarin continues to allow the risk for blood clots.  Follow your PT and INR blood testing schedule. The PT and INR results allow your health care provider to adjust your dose of warfarin. It is very important that you have your PT and INR tested as often as told by your health care provider.  Avoid major changes in your diet, or tell your health care provider before you change your diet. Arrange a visit with a registered dietitian to answer your questions. Many foods, especially foods that are high in vitamin K, can interfere with warfarin and affect the PT and INR results. Eat a consistent amount of foods that are high in vitamin K, such as:  Spinach, kale, broccoli, cabbage, collard greens, turnip greens, Brussels sprouts, peas, cauliflower, seaweed, and parsley.  Beef liver and pork liver.  Green tea.  Soybean oil.  Tell your health care provider about any and all medicines, vitamins, and supplements that you take, including aspirin and other over-the-counter anti-inflammatory medicines. Be especially cautious with aspirin and anti-inflammatory medicines. Do not take those before you ask your health care provider if it is safe to do so. This is important because many medicines can interfere with warfarin and affect the PT and INR results.  Do not start or stop taking any over-the-counter or prescription medicine unless your health care provider or pharmacist tells you to do so. If you take warfarin, you will also need to do these things:  Hold pressure over cuts for longer than usual.  Tell your dentist and other health care providers that you are taking warfarin before you have any procedures in which bleeding may occur.  Avoid alcohol or drink very small amounts. Tell your health care provider if you change your alcohol intake.  Do not use tobacco products, including cigarettes, chewing tobacco, and e-cigarettes. If you need help quitting, ask your health care provider.  Avoid  contact sports. General Instructions  Take over-the-counter and prescription medicines only as told by your health care provider. Anticoagulant medicines can have side effects, including easy bruising and difficulty stopping bleeding. If you are prescribed an anticoagulant, you will also need to do these things:  Hold pressure over cuts for longer than usual.  Tell your dentist and other health care providers that you are taking anticoagulants before you have any procedures in which bleeding may occur.  Avoid contact sports.  Wear a medical alert bracelet or carry a medical alert card that says you have had a PE.  Ask your health care provider how soon you can go back to your normal  activities. Stay active to prevent new blood clots from forming.  Make sure to exercise while traveling or when you have been sitting or standing for a long period of time. It is very important to exercise. Exercise your legs by walking or by tightening and relaxing your leg muscles often. Take frequent walks.  Wear compression stockings as told by your health care provider to help prevent more blood clots from forming.  Do not use tobacco products, including cigarettes, chewing tobacco, and e-cigarettes. If you need help quitting, ask your health care provider.  Keep all follow-up appointments with your health care provider. This is important. PREVENTION Take these actions to decrease your risk of developing another DVT:  Exercise regularly. For at least 30 minutes every day, engage in:  Activity that involves moving your arms and legs.  Activity that encourages good blood flow through your body by increasing your heart rate.  Exercise your arms and legs every hour during long-distance travel (over 4 hours). Drink plenty of water and avoid drinking alcohol while traveling.  Avoid sitting or lying in bed for long periods of time without moving your legs.  Maintain a weight that is appropriate for your  height. Ask your health care provider what weight is healthy for you.  If you are a woman who is over 33 years of age, avoid unnecessary use of medicines that contain estrogen. These include birth control pills.  Do not smoke, especially if you take estrogen medicines. If you need help quitting, ask your health care provider. If you are hospitalized, prevention measures may include:  Early walking after surgery, as soon as your health care provider says that it is safe.  Receiving anticoagulants to prevent blood clots.If you cannot take anticoagulants, other options may be available, such as wearing compression stockings or using different types of devices. SEEK IMMEDIATE MEDICAL CARE IF:  You have new or increased pain, swelling, or redness in an arm or leg.  You have numbness or tingling in an arm or leg.  You have shortness of breath while active or at rest.  You have chest pain.  You have a rapid or irregular heartbeat.  You feel light-headed or dizzy.  You cough up blood.  You notice blood in your vomit, bowel movement, or urine. These symptoms may represent a serious problem that is an emergency. Do not wait to see if the symptoms will go away. Get medical help right away. Call your local emergency services (911 in the [place] Do not drive yourself to the hospital.   This information is not intended to replace advice given to you by your health care provider. Make sure you discuss any questions you have with your health care provider.   Document Released: 06/19/2005 Document Revised: 03/10/2015 Document Reviewed: 10/14/2014 Elsevier Interactive Patient Education 2016 [employer]   Pulmonary Embolism A pulmonary embolism (PE) is a sudden blockage or decrease of blood flow in one lung or both lungs. Most blockages come from a blood clot that travels from the legs or the pelvis to the lungs. PE is a dangerous and potentially life-threatening condition if it is not treated  right away. CAUSES A pulmonary embolism occurs most commonly when a blood clot travels from one of your veins to your lungs. Rarely, PE is caused by air, fat, amniotic fluid, or part of a tumor traveling through your veins to your lungs. RISK FACTORS A PE is more likely to develop in:  People who smoke.  People who  areolder, especially over 32 years of age.  People who are overweight (obese).  People who sit or lie still for a long time, such as during long-distance travel (over 4 hours), bed rest, hospitalization, or during recovery from certain medical conditions like a stroke.  People who do not engage in much physical activity (sedentary lifestyle).  People who have chronic breathing disorders.  People whohave a personal or family history of blood clots or blood clotting disease.  People whohave peripheral vascular disease (PVD), diabetes, or some types of cancer.  People who haveheart disease, especially if the person had a recent heart attack or has congestive heart failure.  People who have neurological diseases that affect the legs (leg paresis).  People who have had a traumatic injury, such as breaking a hip or leg.  People whohave recently had major or lengthy surgery, especially on the hip, knee, or abdomen.  People who have hada central line placed inside a large vein.  People who takemedicines that contain the hormone estrogen. These include birth control pills and hormone replacement therapy.  Pregnancy or during childbirth or the postpartum period. SIGNS AND SYMPTOMS  The symptoms of a PE usually start suddenly and include:  Shortness of breath while active or at rest.  Coughing or coughing up blood or blood-tinged mucus.  Chest pain that is often worse with deep breaths.  Rapid or irregular heartbeat.  Feeling light-headed or dizzy.  Fainting.  Feelinganxious.  Sweating. There may also be pain and swelling in a leg if that is where the  blood clot started. These symptoms may represent a serious problem that is an emergency. Do not wait to see if the symptoms will go away. Get medical help right away. Call your local emergency services (911 in the [place] Do not drive yourself to the hospital. DIAGNOSIS Your health care provider will take a medical history and perform a physical exam. You may also have other tests, including:  Blood tests to assess the clotting properties of your blood, assess oxygen levels in your blood, and find blood clots.  Imaging tests, such as CT, ultrasound, MRI, X-ray, and other tests to see if you have clots anywhere in your body.  An electrocardiogram (ECG) to look for heart strain from blood clots in the lungs. TREATMENT The main goals of PE treatment are:  To stop a blood clot from growing larger.  To stop new blood clots from forming. The type of treatment that you receive depends on many factors, such as the cause of your PE, your risk for bleeding or developing more clots, and other medical conditions that you have. Sometimes, a combination of treatments is necessary. This condition may be treated with:  Medicines, including newer oral blood thinners (anticoagulants), warfarin, low molecular weight heparins, thrombolytics, or heparins.  Wearing compression stockings or using different types of devices.  Surgery (rare) to remove the blood clot or to place a filter in your abdomen to stop the blood clot from traveling to your lungs. Treatments for a PE are often divided into immediate treatment, long-term treatment (up to 3 months after PE), and extended treatment (more than 3 months after PE). Your treatment may continue for several months. This is called maintenance therapy, and it is used to prevent the forming of new blood clots. You can work with your health care provider to choose the treatment program that is best for you. What are anticoagulants? Anticoagulants are medicines that treat  PEs. They can stop current blood  clots from growing and stop new clots from forming. They cannot dissolve existing clots. Your body dissolves clots by itself over time. Anticoagulants are given by mouth, by injection, or through an IV tube. What are thrombolytics? Thrombolytics are clot-dissolving medicines that are used to dissolve a PE. They carry a high risk of bleeding, so they tend to be used only in severe cases or if you have very low blood pressure. HOME CARE INSTRUCTIONS If you are taking a newer oral anticoagulant:  Take the medicine every single day at the same time each day.  Understand what foods and drugs interact with this medicine.  Understand that there are no regular blood tests required when using this medicine.  Understandthe side effects of this medicine, including excessive bruising or bleeding. Ask your health care provider or pharmacist about other possible side effects. If you are taking warfarin:  Understand how to take warfarin and know which foods can affect how warfarin works in [employer].  Understand that it is dangerous to taketoo much or too little warfarin. Too much warfarin increases the risk of bleeding. Too little warfarin continues to allow the risk for blood clots.  Follow your PT and INR blood testing schedule. The PT and INR results allow your health care provider to adjust your dose of warfarin. It is very important that you have your PT and INR tested as often as told by your health care provider.  Avoid major changes in your diet, or tell your health care provider before you change your diet. Arrange a visit with a registered dietitian to answer your questions. Many foods, especially foods that are high in vitamin K, can interfere with warfarin and affect the PT and INR results. Eat a consistent amount of foods that are high in vitamin K, such as:  Spinach, kale, broccoli, cabbage, collard greens, turnip greens, Brussels sprouts, peas,  cauliflower, seaweed, and parsley.  Beef liver and pork liver.  Green tea.  Soybean oil.  Tell your health care provider about any and all medicines, vitamins, and supplements that you take, including aspirin and other over-the-counter anti-inflammatory medicines. Be especially cautious with aspirin and anti-inflammatory medicines. Do not take those before you ask your health care provider if it is safe to do so. This is important because many medicines can interfere with warfarin and affect the PT and INR results.  Do not start or stop taking any over-the-counter or prescription medicine unless your health care provider or pharmacist tells you to do so. If you take warfarin, you will also need to do these things:  Hold pressure over cuts for longer than usual.  Tell your dentist and other health care providers that you are taking warfarin before you have any procedures in which bleeding may occur.  Avoid alcohol or drink very small amounts. Tell your health care provider if you change your alcohol intake.  Do not use tobacco products, including cigarettes, chewing tobacco, and e-cigarettes. If you need help quitting, ask your health care provider.  Avoid contact sports. General Instructions  Take over-the-counter and prescription medicines only as told by your health care provider. Anticoagulant medicines can have side effects, including easy bruising and difficulty stopping bleeding. If you are prescribed an anticoagulant, you will also need to do these things:  Hold pressure over cuts for longer than usual.  Tell your dentist and other health care providers that you are taking anticoagulants before you have any procedures in which bleeding may occur.  Avoid contact sports.  Wear a medical alert bracelet or carry a medical alert card that says you have had a PE.  Ask your health care provider how soon you can go back to your normal activities. Stay active to prevent new blood  clots from forming.  Make sure to exercise while traveling or when you have been sitting or standing for a long period of time. It is very important to exercise. Exercise your legs by walking or by tightening and relaxing your leg muscles often. Take frequent walks.  Wear compression stockings as told by your health care provider to help prevent more blood clots from forming.  Do not use tobacco products, including cigarettes, chewing tobacco, and e-cigarettes. If you need help quitting, ask your health care provider.  Keep all follow-up appointments with your health care provider. This is important. PREVENTION Take these actions to decrease your risk of developing another PE:  Exercise regularly. For at least 30 minutes every day, engage in:  Activity that involves moving your arms and legs.  Activity that encourages good blood flow through your body by increasing your heart rate.  Exercise your arms and legs every hour during long-distance travel (over 4 hours). Drink plenty of water and avoid drinking alcohol while traveling.  Avoid sitting or lying in bed for long periods of time without moving your legs.  Maintain a weight that is appropriate for your height. Ask your health care provider what weight is healthy for you.  If you are a woman who is over 5 years of age, avoid unnecessary use of medicines that contain estrogen. These include birth control pills.  Do not smoke, especially if you take estrogen medicines. If you need help quitting, ask your health care provider.  If you are at very high risk for PE, wear compression stockings.  If you recently had a PE, have regularly scheduled ultrasound testing on your legs to check for new blood clots. If you are hospitalized, prevention measures may include:  Early walking after surgery, as soon as your health care provider says that it is safe.  Receiving anticoagulants to prevent blood clots. If you cannot take  anticoagulants, other options may be available, such as wearing compression stockings or using different types of devices. SEEK IMMEDIATE MEDICAL CARE IF:  You have new or increased pain, swelling, or redness in an arm or leg.  You have numbness or tingling in an arm or leg.  You have shortness of breath while active or at rest.  You have chest pain.  You have a rapid or irregular heartbeat.  You feel light-headed or dizzy.  You cough up blood.  You notice blood in your vomit, bowel movement, or urine.  You have a fever. These symptoms may represent a serious problem that is an emergency. Do not wait to see if the symptoms will go away. Get medical help right away. Call your local emergency services (911 in the [place] Do not drive yourself to the hospital.   This information is not intended to replace advice given to you by your health care provider. Make sure you discuss any questions you have with your health care provider.   Document Released: 06/16/2000 Document Revised: 03/10/2015 Document Reviewed: 10/14/2014 Elsevier Interactive Patient Education 2016 Elsevier Inc.   Heart-Healthy Eating Plan Many factors influence your heart health, including eating and exercise habits. Heart (coronary) risk increases with abnormal blood fat (lipid) levels. Heart-healthy meal planning includes limiting unhealthy fats, increasing healthy fats, and making other small dietary  changes. This includes maintaining a healthy body weight to help keep lipid levels within a normal range. WHAT IS MY PLAN?  Your health care provider recommends that you:  Get no more than _________% of the total calories in your daily diet from fat.  Limit your intake of saturated fat to less than _________% of your total calories each day.  Limit the amount of cholesterol in your diet to less than _________ mg per day. WHAT TYPES OF FAT SHOULD I CHOOSE?  Choose healthy fats more often. Choose monounsaturated and  polyunsaturated fats, such as olive oil and canola oil, flaxseeds, walnuts, almonds, and seeds.  Eat more omega-3 fats. Good choices include salmon, mackerel, sardines, tuna, flaxseed oil, and ground flaxseeds. Aim to eat fish at least two times each week.  Limit saturated fats. Saturated fats are primarily found in animal products, such as meats, butter, and cream. Plant sources of saturated fats include palm oil, palm kernel oil, and coconut oil.  Avoid foods with partially hydrogenated oils in them. These contain trans fats. Examples of foods that contain trans fats are stick margarine, some tub margarines, cookies, crackers, and other baked goods. WHAT GENERAL GUIDELINES DO I NEED TO FOLLOW?  Check food labels carefully to identify foods with trans fats or high amounts of saturated fat.  Fill one half of your plate with vegetables and green salads. Eat 4-5 servings of vegetables per day. A serving of vegetables equals 1 cup of raw leafy vegetables,  cup of raw or cooked cut-up vegetables, or  cup of vegetable juice.  Fill one fourth of your plate with whole grains. Look for the word "whole" as the first word in the ingredient list.  Fill one fourth of your plate with lean protein foods.  Eat 4-5 servings of fruit per day. A serving of fruit equals one medium whole fruit,  cup of dried fruit,  cup of fresh, frozen, or canned fruit, or  cup of 100% fruit juice.  Eat more foods that contain soluble fiber. Examples of foods that contain this type of fiber are apples, broccoli, carrots, beans, peas, and barley. Aim to get 20-30 g of fiber per day.  Eat more home-cooked food and less restaurant, buffet, and fast food.  Limit or avoid alcohol.  Limit foods that are high in starch and sugar.  Avoid fried foods.  Cook foods by using methods other than frying. Baking, boiling, grilling, and broiling are all great options. Other fat-reducing suggestions include:  Removing the skin from  poultry.  Removing all visible fats from meats.  Skimming the fat off of stews, soups, and gravies before serving them.  Steaming vegetables in water or broth.  Lose weight if you are overweight. Losing just 5-10% of your initial body weight can help your overall health and prevent diseases such as diabetes and heart disease.  Increase your consumption of nuts, legumes, and seeds to 4-5 servings per week. One serving of dried beans or legumes equals  cup after being cooked, one serving of nuts equals 1 ounces, and one serving of seeds equals  ounce or 1 tablespoon.  You may need to monitor your salt (sodium) intake, especially if you have high blood pressure. Talk with your health care provider or dietitian to get more information about reducing sodium. WHAT FOODS CAN I EAT? Grains Breads, including [place], white, pita, wheat, raisin, rye, oatmeal, and [place]. Tortillas that are neither fried nor made with lard or trans fat. Low-fat rolls, including hotdog  and hamburger buns and English muffins. Biscuits. Muffins. Waffles. Pancakes. Light popcorn. Whole-grain cereals. Flatbread. Melba toast. Pretzels. Breadsticks. Rusks. Low-fat snacks and crackers, including oyster, saltine, matzo, graham, animal, and rye. Rice and pasta, including brown rice and those that are made with whole wheat. Vegetables All vegetables. Fruits All fruits, but limit coconut. Meats and Other Protein Sources Lean, well-trimmed beef, veal, pork, and lamb. Chicken and [place] without skin. All fish and shellfish. Wild duck, rabbit, pheasant, and venison. Egg whites or low-cholesterol egg substitutes. Dried beans, peas, lentils, and tofu.Seeds and most nuts. Dairy Low-fat or nonfat cheeses, including ricotta, string, and mozzarella. Skim or 1% milk that is liquid, powdered, or evaporated. Buttermilk that is made with low-fat milk. Nonfat or low-fat yogurt. Beverages Mineral water. Diet carbonated beverages. Sweets and  Desserts Sherbets and fruit ices. Honey, jam, marmalade, jelly, and syrups. Meringues and gelatins. Pure sugar candy, such as hard candy, jelly beans, gumdrops, mints, marshmallows, and small amounts of dark chocolate. [employer]. Eat all sweets and desserts in moderation. Fats and Oils Nonhydrogenated (trans-free) margarines. Vegetable oils, including soybean, sesame, sunflower, olive, peanut, safflower, corn, canola, and cottonseed. Salad dressings or mayonnaise that are made with a vegetable oil. Limit added fats and oils that you use for cooking, baking, salads, and as spreads. Other Cocoa powder. Coffee and tea. All seasonings and condiments. The items listed above may not be a complete list of recommended foods or beverages. Contact your dietitian for more options. WHAT FOODS ARE NOT RECOMMENDED? Grains Breads that are made with saturated or trans fats, oils, or whole milk. Croissants. Butter rolls. Cheese breads. Sweet rolls. Donuts. Buttered popcorn. Chow mein noodles. High-fat crackers, such as cheese or butter crackers. Meats and Other Protein Sources Fatty meats, such as hotdogs, short ribs, sausage, spareribs, bacon, ribeye roast or steak, and mutton. High-fat deli meats, such as salami and bologna. Caviar. Domestic duck and goose. Organ meats, such as kidney, liver, sweetbreads, brains, gizzard, chitterlings, and heart. Dairy Cream, sour cream, cream cheese, and creamed cottage cheese. Whole milk cheeses, including blue (bleu), [address], [place], [place], [address], [place], [address], [place], [place], and [place]. Whole or 2% milk that is liquid, evaporated, or condensed. Whole buttermilk. Cream sauce or high-fat cheese sauce. Yogurt that is made from whole milk. Beverages Regular sodas and drinks with added sugar. Sweets and Desserts Frosting. Pudding. Cookies. Cakes other than angel food cake. Candy that has milk chocolate or white chocolate, hydrogenated fat, butter, coconut,  or unknown ingredients. Buttered syrups. Full-fat ice cream or ice cream drinks. Fats and Oils Gravy that has suet, meat fat, or shortening. Cocoa butter, hydrogenated oils, palm oil, coconut oil, palm kernel oil. These can often be found in baked products, candy, fried foods, nondairy creamers, and whipped toppings. Solid fats and shortenings, including bacon fat, salt pork, lard, and butter. Nondairy cream substitutes, such as coffee creamers and sour cream substitutes. Salad dressings that are made of unknown oils, cheese, or sour cream. The items listed above may not be a complete list of foods and beverages to avoid. Contact your dietitian for more information.   This information is not intended to replace advice given to you by your health care provider. Make sure you discuss any questions you have with your health care provider.   Document Released: 03/28/2008 Document Revised: 07/10/2014 Document Reviewed: 12/11/2013 Elsevier Interactive Patient Education [employer]

## 2015-08-13 NOTE — Progress Notes (Signed)
ANTICOAGULATION CONSULT NOTE - Initial Consult  Pharmacy Consult for heparin Indication: pulmonary embolus and DVT  No Known Allergies  Patient Measurements: Height:  (157.5 cm) Weight: 216 lb 4.8 oz (98.113 kg) IBW/kg (Calculated) : 50.1 Heparin Dosing Weight: 75kg  Vital Signs: Temp: 98.2 F (36.8 C) (02/10 0407) Temp Source: Oral (02/10 0407) BP: 124/77 mmHg (02/10 0315) Pulse Rate: 93 (02/10 0407)  Labs:  Recent Labs  08/13/15 0120 08/13/15 0139 08/13/15 0407 08/13/15 0408  HGB 11.4* 13.6 11.0*  --   HCT 34.1* 40.0 32.5*  --   PLT 182  --  174  --   APTT  --   --   --  34  LABPROT  --   --   --  14.2  INR  --   --   --  1.08  CREATININE  --  0.80 0.74  --   TROPONINI  --   --   --  <0.03    Estimated Creatinine Clearance: 114.5 mL/min (by C-G formula based on Cr of 0.74).   Medical History: Past Medical History  Diagnosis Date  . No pertinent past medical history     Medications:  Prescriptions prior to admission  Medication Sig Dispense Refill Last Dose  . TRI-PREVIFEM 0.18/0.215/0.25 MG-35 MCG tablet Take 1 tablet by mouth daily.  3 08/12/2015 at Unknown time  . HYDROcodone-acetaminophen (NORCO) 5-325 MG per tablet Take 1-2 tablets by mouth every 6 (six) hours as needed for severe pain. (Patient not taking: Reported on 08/13/2015) 10 tablet 0 Completed Course at Unknown time  . methocarbamol (ROBAXIN) 500 MG tablet Take 1 tablet (500 mg total) by mouth 2 (two) times daily. (Patient not taking: Reported on 08/13/2015) 20 tablet 0 Completed Course at Unknown time  . naproxen (NAPROSYN) 500 MG tablet Take 1 tablet (500 mg total) by mouth 2 (two) times daily. (Patient not taking: Reported on 08/13/2015) 30 tablet 0 Completed Course at Unknown time   Scheduled:  . sodium chloride flush  3 mL Intravenous Q12H   Infusions:  . sodium chloride 75 mL/hr at 08/13/15 0314    Assessment: 29yo female c/o PLE pain and swelling as well as CP, CT confirms PE w/  evidence of RHS, to begin heparin.  Goal of Therapy:  Heparin level 0.3-0.7 units/ml Monitor platelets by anticoagulation protocol: Yes   Plan:  Rec'd Lovenox  SQ at 0130; will begin heparin gtt ~10hr after Lovenox dose at 1100 units/hr and monitor heparin levels and CBC.  Vernard Gambles, PharmD, BCPS  08/13/2015,5:13 AM

## 2015-08-13 NOTE — Progress Notes (Signed)
*  PRELIMINARY RESULTS* Echocardiogram 2D Echocardiogram has been performed.  Jeryl Columbia 08/13/2015, 10:11 AM

## 2015-08-13 NOTE — ED Notes (Signed)
Patient transported to CT scan . 

## 2015-08-13 NOTE — Care Management Note (Addendum)
Case Management Note  Patient Details  Name: Angel Ho MRN: [id number] Date of Birth: March 18, 1986  Subjective/Objective: Pt admitted and found to be positive for PE.                    Action/Plan: Plan will be for d/c home on Xarelto. Benefits check in process and will make pt aware once completed. CM will provide pt with Xarelto 30 day free and zero co pay card. No further needs from CM at this time.    Expected Discharge Date:                  Expected Discharge Plan:  Home/Self Care  In-House Referral:  NA  Discharge planning Services  CM Consult, Medication Assistance  Post Acute Care Choice:    Choice offered to:  NA  DME Arranged:  N/A DME Agency:  NA  HH Arranged:  NA HH Agency:  NA  Status of Service:  Completed, signed off  Medicare Important Message Given:    Date Medicare IM Given:    Medicare IM give by:    Date Additional Medicare IM Given:    Additional Medicare Important Message give by:     If discussed at Long Length of Stay Meetings, dates discussed:    Additional Comments: 1454 08-13-15 Tomi Bamberger, RN,BSN [phone] CM did speak with pt and she uses [facility] on [address]. Medication is available.   Gala Lewandowsky, RN 08/13/2015, 1:54 PM

## 2015-08-13 NOTE — ED Provider Notes (Signed)
CSN: [id number]     Arrival date & time 08/12/15  2201 History  By signing my name below, I, Freida Busman, attest that this documentation has been prepared under the direction and in the presence of Tomasita Crumble, MD . Electronically Signed: Freida Busman, Scribe. 08/13/2015. 1:19 AM.    Chief Complaint  Patient presents with  . Claudication    The history is provided by the patient. No language interpreter was used.    HPI Comments:  Angel Ho is a 30 y.o. female who presents to the Emergency Department complaining of moderate cramping pain and swelling to her right calf x 2 days. She reports h/o similar cramping pain but denies h/o swelling in the extremity. Her pain is exacerbated when ambulating. She reports associated mild CP and an episode of SOB 3 days ago. Pt is on birth control Tri-Previfem, which she has been taking for a few months.  She denies h/o blood clots, family h/o blood clots, and recent long periods of immobilization. She also denies fever. No alleviating factors noted.  Past Medical History  Diagnosis Date  . No pertinent past medical history    Past Surgical History  Procedure Laterality Date  . No past surgeries     Family History  Problem Relation Age of Onset  . Diabetes Father   . Diabetes Other    Social History  Substance Use Topics  . Smoking status: Former Smoker    Types: Cigarettes    Quit date: 04/08/2011  . Smokeless tobacco: None  . Alcohol Use: Yes     Comment: occasional   OB History    Gravida Para Term Preterm AB TAB SAB Ectopic Multiple Living   Review of Systems 10 systems reviewed and all are negative for acute change except as noted in the HPI.   Allergies  Review of patient's allergies indicates no known allergies.  Home Medications   Prior to Admission medications   Medication Sig Start Date End Date Taking? Authorizing Provider  TRI-PREVIFEM 0.18/0.215/0.25 MG-35 MCG tablet Take 1 tablet by  mouth daily. 07/23/15  Yes Historical Provider, MD  HYDROcodone-acetaminophen (NORCO) 5-325 MG per tablet Take 1-2 tablets by mouth every 6 (six) hours as needed for severe pain. Patient not taking: Reported on 08/13/2015 02/27/14   Doug Sou, MD  methocarbamol (ROBAXIN) 500 MG tablet Take 1 tablet (500 mg total) by mouth 2 (two) times daily. Patient not taking: Reported on 08/13/2015 09/15/14   Joycie Peek, PA-C  naproxen (NAPROSYN) 500 MG tablet Take 1 tablet (500 mg total) by mouth 2 (two) times daily. Patient not taking: Reported on 08/13/2015 09/15/14   Joycie Peek, PA-C   BP 126/77 mmHg  Pulse 83  Temp(Src) 98.4 F (36.9 C) (Oral)  Resp 16  Ht  (1.575 m)  Wt 215 lb 7 oz (97.722 kg)  BMI 39.39 kg/m2  SpO2 99%  LMP 07/11/2015 (Approximate) Physical Exam  Constitutional: She is oriented to person, place, and time. She appears well-developed and well-nourished. No distress.  HENT:  Head: Normocephalic and atraumatic.  Nose: Nose normal.  Mouth/Throat: Oropharynx is clear and moist. No oropharyngeal exudate.  Eyes: Conjunctivae and EOM are normal. Pupils are equal, round, and reactive to light. No scleral icterus.  Neck: Normal range of motion. Neck supple. No JVD present. No tracheal deviation present. No thyromegaly present.  Cardiovascular: Normal rate, regular rhythm and normal heart sounds.  Exam  reveals no gallop and no friction rub.   No murmur heard. Pulmonary/Chest: Effort normal and breath sounds normal. No respiratory distress. She has no wheezes. She exhibits no tenderness.  Abdominal: Soft. Bowel sounds are normal. She exhibits no distension and no mass. There is no tenderness. There is no rebound and no guarding.  Musculoskeletal: Normal range of motion. She exhibits edema.  RLE swelling and edema Warm to touch Positive Homans sign  Lymphadenopathy:    She has no cervical adenopathy.  Neurological: She is alert and oriented to person, place, and time.  No cranial nerve deficit. She exhibits normal muscle tone.  Skin: Skin is warm and dry. No rash noted. No erythema. No pallor.  Nursing note and vitals reviewed.   ED Course  Procedures   DIAGNOSTIC STUDIES:  Oxygen Saturation is 99% on RA, normal by my interpretation.    COORDINATION OF CARE:  1:15 AM Discussed treatment plan with pt at bedside and pt agreed to plan.  Labs Review Labs Reviewed  CBC WITH DIFFERENTIAL/PLATELET - Abnormal; Notable for the following:    Hemoglobin 11.4 (*)    HCT 34.1 (*)    All other components within normal limits  I-STAT CHEM 8, ED    Imaging Review Ct Angio Chest Pe W/cm &/or Wo Cm  08/13/2015  CLINICAL DATA:  Acute onset of moderate cramping and swelling at the right calf. Mild generalized chest pain and shortness of breath. Initial encounter. Initial encounter. EXAM: CT ANGIOGRAPHY CHEST WITH CONTRAST TECHNIQUE: Multidetector CT imaging of the chest was performed using the standard protocol during bolus administration of intravenous contrast. Multiplanar CT image reconstructions and MIPs were obtained to evaluate the vascular anatomy. CONTRAST:  OMNIPAQUE IOHEXOL 350 MG/ML SOLN COMPARISON:  Chest and left rib radiographs performed 02/27/2014 FINDINGS: Pulmonary embolus is noted within pulmonary arteries extending to all lobes of both lungs, more prominent on the right. The RV/LV ratio of 1.1 is consistent with at least submassive pulmonary embolus. The lungs are essentially clear bilaterally There is no evidence of significant focal consolidation, pleural effusion or pneumothorax. No masses are identified; no abnormal focal contrast enhancement is seen. A 1.7 cm azygoesophageal recess node is seen. Remaining visualized nodes are normal in size. No pericardial effusion is identified. The great vessels are grossly unremarkable in appearance. No axillary lymphadenopathy is seen. The visualized portions of the thyroid gland are unremarkable in  appearance. The visualized portions of the liver and spleen are unremarkable. The visualized portions of the pancreas, gallbladder, stomach, adrenal glands and kidneys are within normal limits. No acute osseous abnormalities are seen. Review of the MIP images confirms the above findings. IMPRESSION: 1. Pulmonary embolus noted within pulmonary arteries extending to all lobes of both lungs, more prominent on the right. Positive for acute PE with CT evidence of right heart strain (RV/LV Ratio = 1.1) consistent with at least submassive (intermediate risk) PE. The presence of right heart strain has been associated with an increased risk of morbidity and mortality. Please activate Code PE by paging [phone]. 2. Lungs essentially clear bilaterally. 3. 1.7 cm azygoesophageal recess node seen, of uncertain significance. Critical Value/emergent results were called by telephone at the time of interpretation on 08/13/2015 at 2:20 am to Dr. Tomasita Crumble, who verbally acknowledged these results. Electronically Signed   By: Roanna Raider M.D.   On: 08/13/2015 02:25   I have personally reviewed and evaluated these images and lab results as part of my medical decision-making.   MDM  Final diagnoses:  None    Patient presents to the ED for RLE swelling and pain.  She states she had a remote h/o CP and SOB with lightheadedness as well.   I obtained CTA for evaluation of PE and it was positive for bilateral PE with RHS.  She was placed on heparin gtt.  She is hemodynamically stable currently.  I spoke with Dr. Zane Herald who accepts the patient to step down.   CRITICAL CARE Performed by: Tomasita Crumble   Total critical care time: 40 minutes - bilateral pulmonary embolism with RHS  Critical care time was exclusive of separately billable procedures and treating other patients.  Critical care was necessary to treat or prevent imminent or life-threatening deterioration.  Critical care was time spent personally by me on the  following activities: development of treatment plan with patient and/or surrogate as well as nursing, discussions with consultants, evaluation of patient's response to treatment, examination of patient, obtaining history from patient or surrogate, ordering and performing treatments and interventions, ordering and review of laboratory studies, ordering and review of radiographic studies, pulse oximetry and re-evaluation of patient's condition.    I personally performed the services described in this documentation, which was scribed in my presence. The recorded information has been reviewed and is accurate.      Tomasita Crumble, MD 08/13/15 [phone]

## 2015-08-14 LAB — CBC
HEMATOCRIT: 34.5 % — AB (ref 36.0–46.0)
HEMOGLOBIN: 11.3 g/dL — AB (ref 12.0–15.0)
MCH: 27.8 pg (ref 26.0–34.0)
MCHC: 32.8 g/dL (ref 30.0–36.0)
MCV: 84.8 fL (ref 78.0–100.0)
PLATELETS: 186 10*3/uL (ref 150–400)
RBC: 4.07 MIL/uL (ref 3.87–5.11)
RDW: 14.7 % (ref 11.5–15.5)
WBC: 5.2 10*3/uL (ref 4.0–10.5)

## 2015-08-14 LAB — LUPUS ANTICOAGULANT PANEL
DRVVT: 41.9 s (ref 0.0–44.0)
PTT LA: 44.8 s — AB (ref 0.0–40.6)

## 2015-08-14 LAB — PTT-LA MIX: PTT-LA Mix: 42.2 s — ABNORMAL HIGH (ref 0.0–40.6)

## 2015-08-14 LAB — PROTEIN S, TOTAL: PROTEIN S AG TOTAL: 70 % (ref 60–150)

## 2015-08-14 LAB — PROTEIN C, TOTAL: Protein C, Total: 115 % (ref 60–150)

## 2015-08-14 LAB — PROTEIN S ACTIVITY: Protein S Activity: 74 % (ref 63–140)

## 2015-08-14 LAB — HEXAGONAL PHASE PHOSPHOLIPID: Hexagonal Phase Phospholipid: 14 s — ABNORMAL HIGH (ref 0–11)

## 2015-08-14 LAB — PROTEIN C ACTIVITY: PROTEIN C ACTIVITY: 162 % (ref 73–180)

## 2015-08-14 MED ORDER — RIVAROXABAN 20 MG PO TABS: 20.0000 mg | ORAL_TABLET | Freq: Every day | ORAL | Status: DC

## 2015-08-14 MED ORDER — RIVAROXABAN 15 MG PO TABS: 15.0000 mg | ORAL_TABLET | Freq: Two times a day (BID) | ORAL | Status: DC

## 2015-08-14 NOTE — Progress Notes (Signed)
Feels much better today-ambulated around the unit without any chest pain or shortness of breath. Requesting discharge. Stable for discharge. See discharge summary for details.

## 2015-08-14 NOTE — Care Management Note (Signed)
Case Management Note  Patient Details  Name: Audrea Bolte MRN: [id number] Date of Birth: [birth year]  Subjective/Objective:    Received call from RN stating that pt needs a PCP               Action/Plan: met with pt and provided phone # to find a PCP through [facility]. Encouraged member to call on Monday to make an appointment. She asked if her insurance is going to pay for the Xarelto. She stated that she has the 30 day free card and copay card with 0 copay that was given to her. Informed pt that the pharmacist at the drug store where she fills her prescriptions can provide the information about the coverage and co-payment for Xarelto. She verbalized understanding.   Expected Discharge Date:     08/14/15             Expected Discharge Plan:  Home/Self Care  In-House Referral:  NA  Discharge planning Services  CM Consult, Medication Assistance  Post Acute Care Choice:    Choice offered to:  NA  DME Arranged:  N/A DME Agency:  NA  HH Arranged:  NA HH Agency:  NA  Status of Service:  Completed, signed off  Medicare Important Message Given:    Date Medicare IM Given:    Medicare IM give by:    Date Additional Medicare IM Given:    Additional Medicare Important Message give by:     If discussed at [place] of Stay Meetings, dates discussed:    Additional Comments:  Norina Buzzard, RN 08/14/2015, 11:25 AM

## 2015-08-14 NOTE — Discharge Summary (Signed)
PATIENT DETAILS Name: Angel Ho Age: 30 y.o. Sex: female Date of Birth: 11/30/1985 MRN: [id number]. Admitting Physician: Lorretta Harp, MD PCP:No PCP Per Patient  Admit Date: 08/13/2015 Discharge date: 08/14/2015  Recommendations for Outpatient Follow-up:  1. Follow CBC/BMET periodically while on Xarelto 2. New Medication-Xarelto 3. Age appropriate general health maintenance  PRIMARY DISCHARGE DIAGNOSIS:  Principal Problem:   PE (pulmonary embolism) Active Problems:   Right leg swelling   Right calf pain      PAST MEDICAL HISTORY: Past Medical History  Diagnosis Date  . No pertinent past medical history     DISCHARGE MEDICATIONS: Current Discharge Medication List    START taking these medications   Details  !! Rivaroxaban (XARELTO) 15 MG TABS tablet Take 1 tablet (15 mg total) by mouth 2 (two) times daily with a meal. Qty: 42 tablet, Refills: 0    !! rivaroxaban (XARELTO) 20 MG TABS tablet Take 1 tablet (20 mg total) by mouth daily with supper. Start 09/04/15 at 1700 Qty: 60 tablet, Refills: 0     !! - Potential duplicate medications found. Please discuss with provider.    STOP taking these medications     TRI-PREVIFEM 0.18/0.215/0.25 MG-35 MCG tablet      HYDROcodone-acetaminophen (NORCO) 5-325 MG per tablet      methocarbamol (ROBAXIN) 500 MG tablet      naproxen (NAPROSYN) 500 MG tablet         ALLERGIES:  No Known Allergies  BRIEF HPI:  See H&P, Labs, Consult and Test reports for all details in brief, patient was admitted for evaluation of right lower extremity pain and swelling along with chest pain. In the ED, CTA chest showed pulmonary embolism. She was subsequently admitted for further evaluation and treatment  CONSULTATIONS:   None  PERTINENT RADIOLOGIC STUDIES: Ct Angio Chest Pe W/cm &/or Wo Cm  08/13/2015  CLINICAL DATA:  Acute onset of moderate cramping and swelling at the right calf. Mild generalized chest pain and shortness of  breath. Initial encounter. Initial encounter. EXAM: CT ANGIOGRAPHY CHEST WITH CONTRAST TECHNIQUE: Multidetector CT imaging of the chest was performed using the standard protocol during bolus administration of intravenous contrast. Multiplanar CT image reconstructions and MIPs were obtained to evaluate the vascular anatomy. CONTRAST:  OMNIPAQUE IOHEXOL 350 MG/ML SOLN COMPARISON:  Chest and left rib radiographs performed 02/27/2014 FINDINGS: Pulmonary embolus is noted within pulmonary arteries extending to all lobes of both lungs, more prominent on the right. The RV/LV ratio of 1.1 is consistent with at least submassive pulmonary embolus. The lungs are essentially clear bilaterally There is no evidence of significant focal consolidation, pleural effusion or pneumothorax. No masses are identified; no abnormal focal contrast enhancement is seen. A 1.7 cm azygoesophageal recess node is seen. Remaining visualized nodes are normal in size. No pericardial effusion is identified. The great vessels are grossly unremarkable in appearance. No axillary lymphadenopathy is seen. The visualized portions of the thyroid gland are unremarkable in appearance. The visualized portions of the liver and spleen are unremarkable. The visualized portions of the pancreas, gallbladder, stomach, adrenal glands and kidneys are within normal limits. No acute osseous abnormalities are seen. Review of the MIP images confirms the above findings. IMPRESSION: 1. Pulmonary embolus noted within pulmonary arteries extending to all lobes of both lungs, more prominent on the right. Positive for acute PE with CT evidence of right heart strain (RV/LV Ratio = 1.1) consistent with at least submassive (intermediate risk) PE. The presence of right heart strain has  been associated with an increased risk of morbidity and mortality. Please activate Code PE by paging [phone]. 2. Lungs essentially clear bilaterally. 3. 1.7 cm azygoesophageal recess node  seen, of uncertain significance. Critical Value/emergent results were called by telephone at the time of interpretation on 08/13/2015 at 2:20 am to Dr. Tomasita Crumble, who verbally acknowledged these results. Electronically Signed   By: Roanna Raider M.D.   On: 08/13/2015 02:25     PERTINENT LAB RESULTS: CBC:  Recent Labs  08/13/15 0407 08/14/15 0524  WBC 7.5 5.2  HGB 11.0* 11.3*  HCT 32.5* 34.5*  PLT 174 186   CMET CMP     Component Value Date/Time   NA 140 08/13/2015 0407   K 3.7 08/13/2015 0407   CL 107 08/13/2015 0407   CO2 24 08/13/2015 0407   GLUCOSE 95 08/13/2015 0407   BUN 5* 08/13/2015 0407   CREATININE 0.74 08/13/2015 0407   CALCIUM 8.6* 08/13/2015 0407   PROT 6.2* 08/13/2015 0407   ALBUMIN 2.7* 08/13/2015 0407   AST 17 08/13/2015 0407   ALT 16 08/13/2015 0407   ALKPHOS 61 08/13/2015 0407   BILITOT 0.3 08/13/2015 0407   GFRNONAA >60 08/13/2015 0407   GFRAA >60 08/13/2015 0407    GFR Estimated Creatinine Clearance: 114.2 mL/min (by C-G formula based on Cr of 0.74). No results for input(s): LIPASE, AMYLASE in the last 72 hours.  Recent Labs  08/13/15 0408 08/13/15 0958 08/13/15 1433  TROPONINI <0.03 <0.03 <0.03   Invalid input(s): POCBNP No results for input(s): DDIMER in the last 72 hours. No results for input(s): HGBA1C in the last 72 hours. No results for input(s): CHOL, HDL, LDLCALC, TRIG, CHOLHDL, LDLDIRECT in the last 72 hours. No results for input(s): TSH, T4TOTAL, T3FREE, THYROIDAB in the last 72 hours.  Invalid input(s): FREET3 No results for input(s): VITAMINB12, FOLATE, FERRITIN, TIBC, IRON, RETICCTPCT in the last 72 hours. Coags:  Recent Labs  08/13/15 0408  INR 1.08   Microbiology: Recent Results (from the past 240 hour(s))  MRSA PCR Screening     Status: None   Collection Time: 08/13/15  4:12 AM  Result Value Ref Range Status   MRSA by PCR NEGATIVE NEGATIVE Final    Comment:        The GeneXpert MRSA Assay (FDA approved for  NASAL specimens only), is one component of a comprehensive MRSA colonization surveillance program. It is not intended to diagnose MRSA infection nor to guide or monitor treatment for MRSA infections.      BRIEF HOSPITAL COURSE:  PE (pulmonary embolism): Although right heart strain seen on CT chest, no signs of RV strain on exam-no JVD/edema. She is hemodynamically stable, and basically asymptomatic without chest pain or shortness of breath at this time. 2-D echocardiogram does not show any significant right ventricular dilatation as well. Spoke with Dr. Hillis Range score -Class 1-does not recommend catheter guided thrombolytic treatment. She was initially started on heparin and subsequently transitioned to  Xarelto. She is significantly better, no chest pain or shortness of breath and ambulation. Suspect provoked venous thromboembolism from ongoing oral contraceptive use and smoking. Recommend at least 6 months of anticoagulation, suggest that prior to discontinuing anticoagulation she get a hematology evaluation. Have advised that she  no longer use oral contraceptives, and use alternative means for contraception. She is aware of bleeding complications from anticoagulation, and knows that she needs to seek immediate medical attention have such complications develop.  Active Problems: Right lower extremity DVT: As above.  TODAY-DAY OF DISCHARGE:  Subjective:   Angel Ho today has no headache,no chest abdominal pain,no new weakness tingling or numbness, feels much better wants to go home today.   Objective:   Blood pressure 118/88, pulse 78, temperature 98 F (36.7 C), temperature source Oral, resp. rate 16, height 5\' 2"  (1.575 m), weight 97.569 kg (215 lb 1.6 oz), last menstrual period 07/11/2015, SpO2 100 %.  Intake/Output Summary (Last 24 hours) at 08/14/15 1050 Last data filed at 08/13/15 1555  Gross per 24 hour  Intake 711.22 ml  Output      0 ml  Net 711.22 ml    Filed Weights   08/12/15 2219 08/13/15 0407 08/14/15 0500  Weight: 97.722 kg (215 lb 7 oz) 98.113 kg (216 lb 4.8 oz) 97.569 kg (215 lb 1.6 oz)    Exam Awake Alert, Oriented *3, No new F.N deficits, Normal affect [place].AT,PERRAL Supple Neck,No JVD, No cervical lymphadenopathy appriciated.  Symmetrical Chest wall movement, Good air movement bilaterally, CTAB RRR,No Gallops,Rubs or new Murmurs, No Parasternal Heave +ve B.Sounds, Abd Soft, Non tender, No organomegaly appriciated, No rebound -guarding or rigidity. No Cyanosis, Clubbing or edema, No new Rash or bruise  DISCHARGE CONDITION: Stable  DISPOSITION: Home  DISCHARGE INSTRUCTIONS:    Activity:  As tolerated   Get Medicines reviewed and adjusted: Please take all your medications with you for your next visit with your Primary MD  Please request your Primary MD to go over all hospital tests and procedure/radiological results at the follow up, please ask your Primary MD to get all Hospital records sent to his/her office.  If you experience worsening of your admission symptoms, develop shortness of breath, life threatening emergency, suicidal or homicidal thoughts you must seek medical attention immediately by calling 911 or calling your MD immediately  if symptoms less severe.  You must read complete instructions/literature along with all the possible adverse reactions/side effects for all the Medicines you take and that have been prescribed to you. Take any new Medicines after you have completely understood and accpet all the possible adverse reactions/side effects.   Do not drive when taking Pain medications.   Do not take more than prescribed Pain, Sleep and Anxiety Medications  Special Instructions: If you have smoked or chewed Tobacco  in the last 2 yrs please stop smoking, stop any regular Alcohol  and or any Recreational drug use.  Wear Seat belts while driving.  Please note  You were cared for by a hospitalist  during your hospital stay. Once you are discharged, your primary care physician will handle any further medical issues. Please note that NO REFILLS for any discharge medications will be authorized once you are discharged, as it is imperative that you return to your primary care physician (or establish a relationship with a primary care physician if you do not have one) for your aftercare needs so that they can reassess your need for medications and monitor your lab values.  Diet recommendation: Regular diet  Discharge Instructions    Call MD for:    Complete by:  As directed   If black/blood stools, vomit blood, head injury/MVA     Diet general    Complete by:  As directed      Increase activity slowly    Complete by:  As directed            Follow-up Information    Follow up with Darrow Bussing, MD. Schedule an appointment as soon as possible for a visit in  1 week.   Specialty:  Family Medicine   Contact information:   [address] [place] [address] [phone]       Total Time spent on discharge equals 25  minutes.  SignedJeoffrey Massed 08/14/2015 10:50 AM

## 2015-08-15 LAB — CARDIOLIPIN ANTIBODIES, IGG, IGM, IGA
Anticardiolipin IgG: <9 GPL U/mL (ref 0–14)
Anticardiolipin IgM: <9 MPL U/mL (ref 0–12)

## 2015-08-15 LAB — BETA-2-GLYCOPROTEIN I ABS, IGG/M/A
Beta-2 Glyco I IgG: <9 GPI IgG units (ref 0–20)
Beta-2-Glycoprotein I IgA: <9 GPI IgA units (ref 0–25)
Beta-2-Glycoprotein I IgM: <9 GPI IgM units (ref 0–32)

## 2015-08-16 LAB — HOMOCYSTEINE: HOMOCYSTEINE-NORM: 7.7 umol/L (ref 0.0–15.0)

## 2015-08-16 LAB — ANTINUCLEAR ANTIBODIES, IFA: ANTINUCLEAR ANTIBODIES, IFA: NEGATIVE

## 2015-08-19 LAB — FACTOR 5 LEIDEN

## 2015-08-19 LAB — PROTHROMBIN GENE MUTATION

## 2015-08-24 ENCOUNTER — Emergency Department (HOSPITAL_COMMUNITY)
Admission: EM | Admit: 2015-08-24 | Discharge: 2015-08-24 | Disposition: A | Discharge status: Home / Self Care | Payer: Managed Care, Other (non HMO) | Attending: Emergency Medicine | Admitting: Emergency Medicine

## 2015-08-24 ENCOUNTER — Emergency Department (HOSPITAL_COMMUNITY): Payer: Managed Care, Other (non HMO)

## 2015-08-24 ENCOUNTER — Encounter (HOSPITAL_COMMUNITY): Payer: Self-pay | Admitting: Emergency Medicine

## 2015-08-24 DIAGNOSIS — Z7901 Long term (current) use of anticoagulants: Secondary | ICD-10-CM | POA: Diagnosis not present

## 2015-08-24 DIAGNOSIS — R42 Dizziness and giddiness: Secondary | ICD-10-CM | POA: Diagnosis not present

## 2015-08-24 DIAGNOSIS — R079 Chest pain, unspecified: Secondary | ICD-10-CM | POA: Diagnosis present

## 2015-08-24 DIAGNOSIS — Z86711 Personal history of pulmonary embolism: Secondary | ICD-10-CM | POA: Insufficient documentation

## 2015-08-24 DIAGNOSIS — Z87891 Personal history of nicotine dependence: Secondary | ICD-10-CM | POA: Diagnosis not present

## 2015-08-24 LAB — CBC
HEMATOCRIT: 37 % (ref 36.0–46.0)
HEMOGLOBIN: 12 g/dL (ref 12.0–15.0)
MCH: 27.5 pg (ref 26.0–34.0)
MCHC: 32.4 g/dL (ref 30.0–36.0)
MCV: 84.9 fL (ref 78.0–100.0)
Platelets: 413 10*3/uL — ABNORMAL HIGH (ref 150–400)
RBC: 4.36 MIL/uL (ref 3.87–5.11)
RDW: 14.1 % (ref 11.5–15.5)
WBC: 5.6 10*3/uL (ref 4.0–10.5)

## 2015-08-24 LAB — BASIC METABOLIC PANEL
Anion gap: 9 (ref 5–15)
BUN: 12 mg/dL (ref 6–20)
CALCIUM: 9.3 mg/dL (ref 8.9–10.3)
CHLORIDE: 104 mmol/L (ref 101–111)
CO2: 25 mmol/L (ref 22–32)
CREATININE: 0.95 mg/dL (ref 0.44–1.00)
GFR calc non Af Amer: >60 mL/min (ref >60)
Glucose, Bld: 116 mg/dL — ABNORMAL HIGH (ref 65–99)
Potassium: 3.6 mmol/L (ref 3.5–5.1)
Sodium: 138 mmol/L (ref 135–145)

## 2015-08-24 LAB — I-STAT TROPONIN, ED: Troponin i, poc: 0 ng/mL (ref 0.00–0.08)

## 2015-08-24 MED ORDER — TRAMADOL HCL 50 MG PO TABS: 50.0000 mg | ORAL_TABLET | Freq: Four times a day (QID) | ORAL | Status: DC | PRN

## 2015-08-24 NOTE — Discharge Instructions (Signed)
Chest Wall Pain Chest wall pain is pain in or around the bones and muscles of your chest. Sometimes, an injury causes this pain. Sometimes, the cause may not be known. This pain may take several weeks or longer to get better. HOME CARE Pay attention to any changes in your symptoms. Take these actions to help with your pain:  Rest as told by your doctor.  Avoid activities that cause pain. Try not to use your chest, belly (abdominal), or side muscles to lift heavy things.  If directed, apply ice to the painful area:  Put ice in a plastic bag.  Place a towel between your skin and the bag.  Leave the ice on for 20 minutes, 2-3 times per day.  Take over-the-counter and prescription medicines only as told by your doctor.  Do not use tobacco products, including cigarettes, chewing tobacco, and e-cigarettes. If you need help quitting, ask your doctor.  Keep all follow-up visits as told by your doctor. This is important. GET HELP IF:  You have a fever.  Your chest pain gets worse.  You have new symptoms. GET HELP RIGHT AWAY IF:  You feel sick to your stomach (nauseous) or you throw up (vomit).  You feel sweaty or light-headed.  You have a cough with phlegm (sputum) or you cough up blood.  You are short of breath.   This information is not intended to replace advice given to you by your health care provider. Make sure you discuss any questions you have with your health care provider.  Follow up with her primary care provider as needed or if your symptoms worsen. Continue taking Holmes around 2. Take tramadol as needed for pain. Return to the emergency department if you experience any worsening of your symptoms, loss of consciousness, dizziness, numbness or tingling in extremity, sweatiness, difficulty breathing.

## 2015-08-24 NOTE — ED Provider Notes (Signed)
CSN: [id number]     Arrival date & time 08/24/15  1922 History   First MD Initiated Contact with Patient 08/24/15 2146     Chief Complaint  Patient presents with  . Chest Pain  . Dizziness     (Consider location/radiation/quality/duration/timing/severity/associated sxs/prior Treatment) HPI   Angel Ho is a 30 y.o F with a past medical history of pulmonary embolism (08/13/15) on Xarelto presents to the emergency department today complaining of left-sided chest pain and lightheadedness. Patient states that she has been compliant with her Xarelto since her PE diagnosis on February 10. Patient has been symptom free since that time. Yesterday she felt "twinges" of sharp, intermittent chest pain on the left side of her chest. Today when she woke up she felt lightheaded as if she was going to pass out and continue having the same left-sided chest pain that she was experiencing yesterday. Patient states this feels different than her PE chest pain. Denies syncope, dyspnea.   Past Medical History  Diagnosis Date  . No pertinent past medical history    Past Surgical History  Procedure Laterality Date  . No past surgeries     Family History  Problem Relation Age of Onset  . Diabetes Father   . Diabetes Other    Social History  Substance Use Topics  . Smoking status: Former Smoker    Types: Cigarettes    Quit date: 04/08/2011  . Smokeless tobacco: None  . Alcohol Use: Yes     Comment: occasional   OB History    Gravida Para Term Preterm AB TAB SAB Ectopic Multiple Living   Review of Systems  All other systems reviewed and are negative.     Allergies  Review of patient's allergies indicates no known allergies.  Home Medications   Prior to Admission medications   Medication Sig Start Date End Date Taking? Authorizing Provider  Rivaroxaban (XARELTO) 15 MG TABS tablet Take 1 tablet (15 mg total) by mouth 2 (two) times daily with a meal. 08/14/15    Shanker Levora Dredge, MD  rivaroxaban (XARELTO) 20 MG TABS tablet Take 1 tablet (20 mg total) by mouth daily with supper. Start 09/04/15 at 1700 09/04/15   Shanker Levora Dredge, MD   BP 116/85 mmHg  Pulse 89  Temp(Src) 98 F (36.7 C) (Oral)  Resp 19  Ht  (1.575 m)  Wt 97.07 kg  BMI 39.13 kg/m2  SpO2 100%  LMP 08/20/2015 (Exact Date) Physical Exam  Constitutional: She is oriented to person, place, and time. She appears well-developed and well-nourished. No distress.  HENT:  Head: Normocephalic and atraumatic.  Mouth/Throat: No oropharyngeal exudate.  Eyes: Conjunctivae and EOM are normal. Pupils are equal, round, and reactive to light. Right eye exhibits no discharge. Left eye exhibits no discharge. No scleral icterus.  Neck: Normal range of motion. Neck supple. No JVD present.  Cardiovascular: Normal rate, regular rhythm, normal heart sounds and intact distal pulses.  Exam reveals no gallop and no friction rub.   No murmur heard. Pulmonary/Chest: Effort normal and breath sounds normal. No respiratory distress. She has no wheezes. She has no rales. She exhibits no tenderness.  Abdominal: Soft. She exhibits no distension. There is no tenderness. There is no guarding.  Musculoskeletal: Normal range of motion. She exhibits no edema.  Lymphadenopathy:    She has no cervical adenopathy.  Neurological: She is alert and oriented to person, place,  and time.  Skin: Skin is warm and dry. No rash noted. She is not diaphoretic. No erythema. No pallor.  Psychiatric: She has a normal mood and affect. Her behavior is normal.  Nursing note and vitals reviewed.   ED Course  Procedures (including critical care time) Labs Review Labs Reviewed  BASIC METABOLIC PANEL - Abnormal; Notable for the following:    Glucose, Bld 116 (*)    All other components within normal limits  CBC - Abnormal; Notable for the following:    Platelets 413 (*)    All other components within normal limits  I-STAT TROPOININ,  ED    Imaging Review Dg Chest 2 View  08/24/2015  CLINICAL DATA:  Left medial chest pain, onset yesterday. Recent diagnosis of pulmonary embolus. EXAM: CHEST  2 VIEW COMPARISON:  CT 08/13/2015 FINDINGS: The cardiomediastinal contours are normal. The lungs are clear. Pulmonary vasculature is normal. No consolidation, pleural effusion, or pneumothorax. No acute osseous abnormalities are seen. IMPRESSION: Clear lungs. Electronically Signed   By: Rubye Oaks M.D.   On: 08/24/2015 20:51   I have personally reviewed and evaluated these images and lab results as part of my medical decision-making.   EKG Interpretation   Date/Time:  Tuesday August 24 2015 20:16:58 EST Ventricular Rate:  101 PR Interval:  138 QRS Duration: 76 QT Interval:  348 QTC Calculation: 451 R Axis:   30 Text Interpretation:  Sinus tachycardia Otherwise normal ECG Confirmed by  KOHUT  MD, STEPHEN ([id number]) on 08/24/2015 9:29:48 PM      MDM   Final diagnoses:  Chest pain, unspecified chest pain type    30 y.o F with recent hx of PE, on Xarelto presents to the ED c/o CP and lightheadedness. Pt diagnosed with PE on 2/10. She has been symptom free until yesterday. On presentation to ED, pt appears well. No hypoxia, no tachycardia. No diaphoresis. Pt is a reliable historian and states that she has been compliant with Xarelto. EKG wnl. All blood work wnl. Suspect chest pain is due to resolving PE. Doubt other acute process. Low HEART score. Low suspicion ACS. Will not repeat CT scan today, doubt that findings would change current management. Educated pt on red flag symptoms. Will d/c with PCP follow up. Return precautions outlined in patient discharge instructions.  Patient was discussed with and seen by Dr. Juleen China who agrees with the treatment plan.      Lester Kinsman [place], PA-C 08/25/15 0120  Raeford Razor, MD 08/26/15 2251

## 2015-08-24 NOTE — ED Notes (Signed)
Patient here with complaint of left medial chest pain. States onset yesterday. Recently admitted to this hospital with saddle PE. Currently on Xarelto and states she hasn't missed any doses. Also endorses lightheadedness.

## 2015-10-29 ENCOUNTER — Telehealth: Payer: Self-pay | Admitting: Hematology and Oncology

## 2015-10-29 ENCOUNTER — Encounter: Payer: Self-pay | Admitting: Hematology and Oncology

## 2015-10-29 NOTE — Telephone Encounter (Signed)
Verified address and insurance, faxed referring office, mailed new pt packet, scheduled intake

## 2015-11-08 ENCOUNTER — Telehealth: Payer: Self-pay | Admitting: Hematology and Oncology

## 2015-11-08 NOTE — Telephone Encounter (Signed)
Returned call and s.w. Pt and confirmed 5.9 appt....the patient ok and aware

## 2015-11-09 ENCOUNTER — Encounter: Payer: Self-pay | Admitting: Hematology and Oncology

## 2015-11-09 ENCOUNTER — Telehealth: Payer: Self-pay | Admitting: Hematology and Oncology

## 2015-11-09 ENCOUNTER — Ambulatory Visit (HOSPITAL_BASED_OUTPATIENT_CLINIC_OR_DEPARTMENT_OTHER): Payer: Managed Care, Other (non HMO) | Admitting: Hematology and Oncology

## 2015-11-09 ENCOUNTER — Other Ambulatory Visit: Payer: Self-pay | Admitting: *Deleted

## 2015-11-09 VITALS — BP 118/69 | HR 65 | Temp 98.0°F | Resp 18 | Ht 62.0 in | Wt 205.0 lb

## 2015-11-09 DIAGNOSIS — I2699 Other pulmonary embolism without acute cor pulmonale: Secondary | ICD-10-CM

## 2015-11-09 DIAGNOSIS — I82401 Acute embolism and thrombosis of unspecified deep veins of right lower extremity: Secondary | ICD-10-CM | POA: Diagnosis not present

## 2015-11-09 NOTE — Telephone Encounter (Signed)
appt made and avs printed °

## 2015-11-09 NOTE — Progress Notes (Signed)
[facility] CONSULT NOTE  Patient Care Team: No Pcp Per Patient as PCP - General (General Practice)  CHIEF COMPLAINTS/PURPOSE OF CONSULTATION:  Bilateral pulmonary emboli and right leg DVT  HISTORY OF PRESENTING ILLNESS:  Angel Ho 30 y.o. female is here because of recent diagnosis of bilateral pulmonary emboli and right leg DVT. Patient works at [employer] and went to work in February feeling very short of breath and tired. Or the day her symptoms got worse. She finds her primary care physician who obtained a CT of the chest and asked her to get admitted. She was started on xarelto. CT scan suggested heart strain but there was no clinical findings suggestive of heart strain. She did not need of thrombectomy.  I reviewed her records extensively and collaborated the history with the patient.  MEDICAL HISTORY:  Past Medical History  Diagnosis Date  . No pertinent past medical history     SURGICAL HISTORY: Past Surgical History  Procedure Laterality Date  . No past surgeries      SOCIAL HISTORY: Social History   Social History  . Marital Status: Single    Spouse Name: N/A  . Number of Children: N/A  . Years of Education: N/A   Occupational History  . Not on file.   Social History Main Topics  . Smoking status: Former Smoker    Types: Cigarettes    Quit date: 04/08/2011  . Smokeless tobacco: Not on file  . Alcohol Use: Yes     Comment: occasional  . Drug Use: No  . Sexual Activity: No   Other Topics Concern  . Not on file   Social History Narrative    FAMILY HISTORY: Family History  Problem Relation Age of Onset  . Diabetes Father   . Diabetes Other     ALLERGIES:  has No Known Allergies.  MEDICATIONS:  Current Outpatient Prescriptions  Medication Sig Dispense Refill  . rivaroxaban (XARELTO) 20 MG TABS tablet Take 1 tablet (20 mg total) by mouth daily with supper. Start 09/04/15 at 1700 60 tablet 0  . traMADol (ULTRAM) 50 MG tablet  Take 1 tablet (50 mg total) by mouth every 6 (six) hours as needed. 8 tablet 0   No current facility-administered medications for this visit.    REVIEW OF SYSTEMS:   Constitutional: Denies fevers, chills or abnormal night sweats Eyes: Denies blurriness of vision, double vision or watery eyes Ears, nose, mouth, throat, and face: Denies mucositis or sore throat Respiratory: Denies cough, dyspnea or wheezes Cardiovascular: Denies palpitation, chest discomfort or lower extremity swelling Gastrointestinal:  Denies nausea, heartburn or change in bowel habits Skin: Denies abnormal skin rashes Lymphatics: Denies new lymphadenopathy or easy bruising Neurological:Denies numbness, tingling or new weaknesses Behavioral/Psych: Mood is stable, no new changes   All other systems were reviewed with the patient and are negative.  PHYSICAL EXAMINATION: ECOG PERFORMANCE STATUS: 0 - Asymptomatic  Filed Vitals:   11/09/15 1251  BP: 118/69  Pulse: 65  Temp: 98 F (36.7 C)  Resp: 18   Filed Weights   11/09/15 1251  Weight: 205 lb (92.987 kg)    GENERAL:alert, no distress and comfortable SKIN: skin color, texture, turgor are normal, no rashes or significant lesions EYES: normal, conjunctiva are pink and non-injected, sclera clear OROPHARYNX:no exudate, no erythema and lips, buccal mucosa, and tongue normal  NECK: supple, thyroid normal size, non-tender, without nodularity LYMPH:  no palpable lymphadenopathy in the cervical, axillary or inguinal LUNGS: clear to auscultation and  percussion with normal breathing effort HEART: regular rate & rhythm and no murmurs and no lower extremity edema ABDOMEN:abdomen soft, non-tender and normal bowel sounds Musculoskeletal:no cyanosis of digits and no clubbing  PSYCH: alert & oriented x 3 with fluent speech NEURO: no focal motor/sensory deficits  LABORATORY DATA:  I have reviewed the data as listed Lab Results  Component Value Date   WBC 5.6 08/24/2015    HGB 12.0 08/24/2015   HCT 37.0 08/24/2015   MCV 84.9 08/24/2015   PLT 413* 08/24/2015   Lab Results  Component Value Date   NA 138 08/24/2015   K 3.6 08/24/2015   CL 104 08/24/2015   CO2 25 08/24/2015    RADIOGRAPHIC STUDIES: I have personally reviewed the radiological reports and agreed with the findings in the report.  ASSESSMENT AND PLAN:  PE (pulmonary embolism) Acute bilateral pulmonary emboli diagnosed 08/13/2015: All lobes of both lungs prominent on the right with the right heart strain, Right lower extremity DVT  Current treatment: Xarelto 20 mg daily.  Xarelto toxicities: Denies any bruising or bleeding  Risk factors for blood clots: I discussed the inherited and acquired risk factors for blood clots.  Recommendation:  1. Workup for inherited risk factors like prothrombin gene mutation, factor V Leiden, and protein C, protein S, antithrombin III, homocysteine 2. Workup for antiphospholipid antibodies   I discussed with her that there are other acquired risk factors that can be modified. She no longer takes birth control pills and will not smoke cigarettes any further. Her work involves sleep sitting in a chair for 8 hours duration. I discussed with her and wrote a letter to her [employer] company to allow her a standing chair so that she does not have to sit for that length of time. Also discussed with her that if she does have antiphospholipid antibody syndrome, and she would need to treat her with anticoagulation for life.  Return to clinic in 2 weeks to discuss the results of the blood work and to come up with a final treatment plan regarding the duration of anticoagulation.    All questions were answered. The patient knows to call the clinic with any problems, questions or concerns.    Sabas Sous, MD 11/09/2015

## 2015-11-09 NOTE — Assessment & Plan Note (Signed)
Acute bilateral pulmonary emboli diagnosed 08/13/2015: All lobes of both lungs prominent on the right with the right heart strain, Right lower extremity DVT  Current treatment: Xarelto 20 mg daily.  Xarelto toxicities: Denies any bruising or bleeding  Risk factors for blood clots: I discussed the inherited and acquired risk factors for blood clots.  Recommendation:  1. Workup for inherited risk factors like prothrombin gene mutation, factor V Leiden, and protein C, protein S, antithrombin III, homocysteine 2. Workup for antiphospholipid antibodies   I discussed with her that there are other acquired risk factors that can be modified. She no longer takes birth control pills and will not smoke cigarettes any further. Her work involves sleep sitting in a chair for 8 hours duration. I discussed with her and wrote a letter to her [employer] company to allow her a standing chair so that she does not have to sit for that length of time. Also discussed with her that if she does have antiphospholipid antibody syndrome, and she would need to treat her with anticoagulation for life.  Return to clinic in 2 weeks to discuss the results of the blood work and to come up with a final treatment plan regarding the duration of anticoagulation.

## 2015-11-23 ENCOUNTER — Encounter: Payer: Self-pay | Admitting: Hematology and Oncology

## 2015-11-23 ENCOUNTER — Ambulatory Visit (HOSPITAL_BASED_OUTPATIENT_CLINIC_OR_DEPARTMENT_OTHER): Payer: Managed Care, Other (non HMO) | Admitting: Hematology and Oncology

## 2015-11-23 VITALS — BP 124/71 | HR 90 | Temp 98.5°F | Resp 18 | Ht 62.0 in | Wt 207.9 lb

## 2015-11-23 DIAGNOSIS — I2699 Other pulmonary embolism without acute cor pulmonale: Secondary | ICD-10-CM

## 2015-11-23 DIAGNOSIS — I82401 Acute embolism and thrombosis of unspecified deep veins of right lower extremity: Secondary | ICD-10-CM | POA: Diagnosis not present

## 2015-11-23 NOTE — Progress Notes (Signed)
Patient Care Team: No Pcp Per Patient as PCP - General (General Practice)  DIAGNOSIS: Bilateral pulmonary emboli and right leg DVT  Current treatment: Xarelto 6 months  CHIEF COMPLIANT: Follow-up after recent blood work  INTERVAL HISTORY: [name] is a 30 year old with above-mentioned history of right leg DVT in bilateral PEs who underwent extensive workup for inherited and acquired hypercoagulable risk factors and is here to discuss the results. She had dental blood work done at [employer] where she currently works. Her breathing has improved. He denies any chest pain.  REVIEW OF SYSTEMS:   Constitutional: Denies fevers, chills or abnormal weight loss Eyes: Denies blurriness of vision Ears, nose, mouth, throat, and face: Denies mucositis or sore throat Respiratory: Denies cough, dyspnea or wheezes Cardiovascular: Denies palpitation, chest discomfort Gastrointestinal:  Denies nausea, heartburn or change in bowel habits Skin: Denies abnormal skin rashes Lymphatics: Denies new lymphadenopathy or easy bruising Neurological:Denies numbness, tingling or new weaknesses Behavioral/Psych: Mood is stable, no new changes  Extremities: No lower extremity edema  All other systems were reviewed with the patient and are negative.  I have reviewed the past medical history, past surgical history, social history and family history with the patient and they are unchanged from previous note.  ALLERGIES:  has No Known Allergies.  MEDICATIONS:  Current Outpatient Prescriptions  Medication Sig Dispense Refill  . rivaroxaban (XARELTO) 20 MG TABS tablet Take 1 tablet (20 mg total) by mouth daily with supper. Start 09/04/15 at 1700 60 tablet 0  . traMADol (ULTRAM) 50 MG tablet Take 1 tablet (50 mg total) by mouth every 6 (six) hours as needed. 8 tablet 0   No current facility-administered medications for this visit.    PHYSICAL EXAMINATION: ECOG PERFORMANCE STATUS: 1 - Symptomatic but  completely ambulatory  Filed Vitals:   11/23/15 1525  BP: 124/71  Pulse: 90  Temp: 98.5 F (36.9 C)  Resp: 18   Filed Weights   11/23/15 1525  Weight: 207 lb 14.4 oz (94.303 kg)    GENERAL:alert, no distress and comfortable SKIN: skin color, texture, turgor are normal, no rashes or significant lesions EYES: normal, Conjunctiva are pink and non-injected, sclera clear OROPHARYNX:no exudate, no erythema and lips, buccal mucosa, and tongue normal  NECK: supple, thyroid normal size, non-tender, without nodularity LYMPH:  no palpable lymphadenopathy in the cervical, axillary or inguinal LUNGS: clear to auscultation and percussion with normal breathing effort HEART: regular rate & rhythm and no murmurs and no lower extremity edema ABDOMEN:abdomen soft, non-tender and normal bowel sounds MUSCULOSKELETAL:no cyanosis of digits and no clubbing  NEURO: alert & oriented x 3 with fluent speech, no focal motor/sensory deficits EXTREMITIES: No lower extremity edema   LABORATORY DATA:  I have reviewed the data as listed   Chemistry      Component Value Date/Time   NA 138 08/24/2015 2023   K 3.6 08/24/2015 2023   CL 104 08/24/2015 2023   CO2 25 08/24/2015 2023   BUN 12 08/24/2015 2023   CREATININE 0.95 08/24/2015 2023      Component Value Date/Time   CALCIUM 9.3 08/24/2015 2023   ALKPHOS 61 08/13/2015 0407   AST 17 08/13/2015 0407   ALT 16 08/13/2015 0407   BILITOT 0.3 08/13/2015 0407       Lab Results  Component Value Date   WBC 5.6 08/24/2015   HGB 12.0 08/24/2015   HCT 37.0 08/24/2015   MCV 84.9 08/24/2015   PLT 413* 08/24/2015   NEUTROABS 3.7 08/13/2015  ASSESSMENT & PLAN:  Acute bilateral pulmonary emboli diagnosed 08/13/2015: All lobes of both lungs prominent on the right with the right heart strain, Right lower extremity DVT  Current treatment: Xarelto 20 mg daily.  Xarelto toxicities: Denies any bruising or bleeding  Risk factors for blood clots: I  discussed the inherited and acquired risk factors for blood clots.  Recommendation:  1. Workup for inherited risk factors like prothrombin gene mutation, factor V Leiden, and protein C, protein S, antithrombin III, homocysteine And  antiphospholipid antibodies : NORMAL  Based upon chest guidelines for unprovoked first episode of DVT, I recommended treatment for 6 months. If she has recurrent blood clots then she would need lifelong anticoagulation.  Return to clinic on an as-needed basis.  No orders of the defined types were placed in this encounter.   The patient has a good understanding of the overall plan. she agrees with it. she will call with any problems that may develop before the next visit here.   Sabas Sous, MD 11/23/2015

## 2015-12-06 ENCOUNTER — Telehealth: Payer: Self-pay | Admitting: Hematology and Oncology

## 2015-12-06 ENCOUNTER — Other Ambulatory Visit: Payer: Self-pay

## 2015-12-06 DIAGNOSIS — I2699 Other pulmonary embolism without acute cor pulmonale: Secondary | ICD-10-CM

## 2015-12-06 MED ORDER — ENOXAPARIN SODIUM 100 MG/ML ~~LOC~~ SOLN: 100.0000 mg | Freq: Two times a day (BID) | SUBCUTANEOUS | Status: DC

## 2015-12-06 NOTE — Progress Notes (Signed)
Received VM from pt stating she just found out she was pregnant and needed to know what she should do about taking Xarelto.  I spoke with Pamelia HoitGudena, MD who gave verbal order for pt to discontinue Xarelto and pt to take Lovenox injections 1mg /kg BID.  Called pt to inform her of this.  Pt states she has never had to give herself injections.  Offered for pt to come in tomorrow for injection teaching.  Pt verbalized desire to come in and have teaching.  POF entered for appointment.  Prescription called in to pharmacy per pt request.  Pt without further questions or concerns at time of call. Pt to come in tomorrow (6/6/) for teaching.

## 2015-12-06 NOTE — Telephone Encounter (Signed)
inj sched pt aware per pof

## 2015-12-07 ENCOUNTER — Ambulatory Visit: Payer: Managed Care, Other (non HMO)

## 2015-12-07 NOTE — Progress Notes (Signed)
Pt was seen and treated by Karen Chafeater Chaney, RN

## 2015-12-07 NOTE — Progress Notes (Signed)
Pt arrived to clinic for Lovenox injection treatment.  Demonstration provided and all questions answered.  Written information on Lovenox given to pt.  Received disability forms from pt.  Forms turned in to managed care.  Pt to call us with any questions or concerns should they arise.

## 2015-12-09 ENCOUNTER — Encounter: Payer: Self-pay | Admitting: Hematology and Oncology

## 2015-12-09 NOTE — Progress Notes (Signed)
reed group form left in pod

## 2015-12-21 LAB — OB RESULTS CONSOLE ANTIBODY SCREEN: Antibody Screen: NEGATIVE

## 2015-12-21 LAB — OB RESULTS CONSOLE GC/CHLAMYDIA
Chlamydia: NEGATIVE
Gonorrhea: NEGATIVE

## 2015-12-21 LAB — OB RESULTS CONSOLE HIV ANTIBODY (ROUTINE TESTING): HIV: NONREACTIVE

## 2015-12-21 LAB — OB RESULTS CONSOLE ABO/RH: RH TYPE: POSITIVE

## 2015-12-21 LAB — OB RESULTS CONSOLE RUBELLA ANTIBODY, IGM: RUBELLA: IMMUNE

## 2015-12-21 LAB — OB RESULTS CONSOLE RPR: RPR: NONREACTIVE

## 2015-12-21 LAB — OB RESULTS CONSOLE HEPATITIS B SURFACE ANTIGEN: HEP B S AG: NEGATIVE

## 2015-12-28 ENCOUNTER — Other Ambulatory Visit: Payer: Self-pay | Admitting: Hematology and Oncology

## 2015-12-28 ENCOUNTER — Telehealth: Payer: Self-pay | Admitting: *Deleted

## 2015-12-28 DIAGNOSIS — I2699 Other pulmonary embolism without acute cor pulmonale: Secondary | ICD-10-CM

## 2015-12-28 NOTE — Telephone Encounter (Signed)
Received request from pharmacy for refill on Lovenox. Called and left VMM for patient to ask if she needed this filled. She is on Xarelto daily and the Lovenox was probably used for bridging due to possible procedure. Left message for patient to return call to clinic.

## 2015-12-28 NOTE — Telephone Encounter (Signed)
Received refill request for Lovenox from pharmacy. Called patient and left message for patient to return call to clinic. Patient is on Xarelto and Lovenox was probably prescribed for bridging due to procedure. Will wait for patient to call before refilling.

## 2016-01-06 ENCOUNTER — Telehealth: Payer: Self-pay | Admitting: *Deleted

## 2016-01-06 NOTE — Telephone Encounter (Signed)
Patient called at 4:25 pm to "check status of Lovenox prior authorization."  This nurse left message with Managed Care, notified collaborative nurse and asked [facility] for any [facility]ringes to tide her over.  Offered to schedule injection here but [place]at [place] until 6:00 pm with arrival at 6:30-ish"  Informed her we will work on this and let her know when authorized.  Reports she "Injected her last injection at 0700 today and next injection due at 1900."

## 2016-01-06 NOTE — Telephone Encounter (Signed)
[facility]riage [facility] regarding prior-auth status of Lovenox injections.  Pt has been on Lovenox since 12/06/15 and last dose given this morning.  Next dose scheduled for 1900.  Upon investigation, prior auth not complete and no documentation of received prior auth paperwork.  Spoke with Raquel regarding prior auth status who stated she would complete at time of conversation (4:45pm) however, more than likely we would not have an answer before this evening when pt needs to inject next dose.  This RN concerned for pt as she is currently pregnant and has been taking Lovenox BID for known PEs. This RN contacted pt's pharmacy to find out more information.  According to the pharmacy representative I spoke with, 2 attemps have been made to obtain prior auth on this medication.  Per pharmacy, pt's insurance requires additional prior auth after 30 days regardless of patient being on medication for last month.  I explained the sensitive nature of obtaining a dose for pt to inject this evening and per pharmacy only option is for pt to pay out of pocket ($185 for one dose).  Pharmacy representative discussed with pharmacist and they stated pt could obtain 2 syringes this evening given sensitive nature of pt being pregnant.  Prior Berkley Harveyauth must be completed by tomorrow for pt to obtain further doses.  I explained that I would continue to follow this matter to ensure prior auth was completed but in the mean time was extremely appreciative of their efforts to assist our patient.  Called and left VM informing pt that she could pick up these 2 syringes and complete her regimen as prescribed and that we would be working on prior auth for remainder of prescription.  I informed pt on VM to contact us should she have any further questions or concerns.  Received additional prior auth form from [facility] and copy placed in Raquel's box should it be needed moving forward.

## 2016-01-07 ENCOUNTER — Encounter: Payer: Self-pay | Admitting: Hematology and Oncology

## 2016-01-07 NOTE — Progress Notes (Signed)
per nita sent for review--24-48hrs for response- per [facility] lovenox approved 01/07/16-01/06/17. I sent to medical recrds i [facility]  [phone]

## 2016-01-07 NOTE — Progress Notes (Signed)
Per nita at [facility] 24-48 hour review for prior auth for lovenox

## 2016-02-01 ENCOUNTER — Telehealth: Payer: Self-pay | Admitting: Hematology and Oncology

## 2016-02-01 NOTE — Telephone Encounter (Signed)
Faxed pt office note to [facility], [place] [phone]

## 2016-02-08 ENCOUNTER — Telehealth: Payer: Self-pay

## 2016-02-08 ENCOUNTER — Other Ambulatory Visit: Payer: Self-pay

## 2016-02-08 DIAGNOSIS — I2699 Other pulmonary embolism without acute cor pulmonale: Secondary | ICD-10-CM

## 2016-02-08 MED ORDER — ENOXAPARIN SODIUM 100 MG/ML ~~LOC~~ SOLN: SUBCUTANEOUS | 2 refills | Status: DC

## 2016-02-08 NOTE — Telephone Encounter (Signed)
Returned call to pt re: scans and follow up visit.  Pt reports she is 6 months out from initial dx of bilateral pulmonary emboli and Rt DVT.  She initially saw Dr. Pamelia HoitGudena in May.  He noted he wanted to see her in 6 months.    She wants to make a follow up appt and have any scans necessary.    Pt reports she is [redacted] weeks pregnant.

## 2016-02-08 NOTE — Telephone Encounter (Signed)
Returned call to patient.  Per her questions, Dr. Pamelia HoitGudena states her 6 months follow up is from May when he saw her, not February when she was diagnosed.    Also per Dr. Pamelia HoitGudena, pregnancy increases the risk of clots and he advises she stay on the lovenox.

## 2016-02-14 NOTE — Telephone Encounter (Signed)
02/08/16 1500 - Called pt - per Dr Pamelia HoitGudena - pt is at higher risk of clots during pregnancy and should remain on anti-coagulant.  He will not perform any scans while she is pregnant.  Confirmed that pt ob/gyn is aware she is on anti-coag.  Pt confirms MD is aware.

## 2016-03-27 ENCOUNTER — Telehealth: Payer: Self-pay | Admitting: *Deleted

## 2016-03-27 NOTE — Telephone Encounter (Signed)
Heparin 7500 Sub Cut Bid

## 2016-03-27 NOTE — Telephone Encounter (Signed)
Joni ReiningNicole from Dr Redmond [facility]illard[facility] at [facility] called.  Dr Normand Sloopillard would like to know if at 36 weeks patient can be switched to heparin so she can have an epidural. If ok, what dose of heparin?

## 2016-03-28 ENCOUNTER — Telehealth: Payer: Self-pay | Admitting: *Deleted

## 2016-03-28 NOTE — Telephone Encounter (Signed)
Angel ReiningNicole at Dr Dillard's office notified it is OK for her to go on heparin 7500 SQ BID as noted on 9/25 telephone note

## 2016-05-03 ENCOUNTER — Telehealth: Payer: Self-pay | Admitting: Hematology and Oncology

## 2016-05-03 NOTE — Telephone Encounter (Signed)
Appointment rescheduled per patient request. Returned call to patient to reschedule 11/03 appointment. Rescheduled to next available.

## 2016-05-05 ENCOUNTER — Ambulatory Visit: Payer: Managed Care, Other (non HMO) | Admitting: Hematology and Oncology

## 2016-05-22 NOTE — Assessment & Plan Note (Signed)
Acute bilateral pulmonary emboli diagnosed 08/13/2015: All lobes of both lungs prominent on the right with the right heart strain, Right lower extremity DVT  Current treatment: Xarelto 20 mg daily.  Xarelto toxicities: Denies any bruising or bleeding  Risk factors for blood clots: I discussed the inherited and acquired risk factors for blood clots.  Recommendation:  1. Workup for inherited risk factors like prothrombin gene mutation, factor V Leiden, and protein C, protein S, antithrombin III, homocysteine And  antiphospholipid antibodies : NORMAL  Based upon chest guidelines for unprovoked first episode of DVT, I recommended treatment for 6 months. If she has recurrent blood clots then she would need lifelong anticoagulation.

## 2016-05-26 ENCOUNTER — Encounter: Payer: Self-pay | Admitting: Hematology and Oncology

## 2016-05-26 ENCOUNTER — Ambulatory Visit (HOSPITAL_BASED_OUTPATIENT_CLINIC_OR_DEPARTMENT_OTHER): Payer: Managed Care, Other (non HMO) | Admitting: Hematology and Oncology

## 2016-05-26 DIAGNOSIS — I2699 Other pulmonary embolism without acute cor pulmonale: Secondary | ICD-10-CM

## 2016-05-26 DIAGNOSIS — Z7901 Long term (current) use of anticoagulants: Secondary | ICD-10-CM | POA: Diagnosis not present

## 2016-05-26 DIAGNOSIS — O99119 Other diseases of the blood and blood-forming organs and certain disorders involving the immune mechanism complicating pregnancy, unspecified trimester: Secondary | ICD-10-CM | POA: Diagnosis not present

## 2016-05-26 DIAGNOSIS — D6862 Lupus anticoagulant syndrome: Secondary | ICD-10-CM

## 2016-05-26 DIAGNOSIS — I2782 Chronic pulmonary embolism: Secondary | ICD-10-CM

## 2016-05-26 MED ORDER — ENOXAPARIN SODIUM 100 MG/ML ~~LOC~~ SOLN: SUBCUTANEOUS | 2 refills | Status: DC

## 2016-05-26 NOTE — Progress Notes (Signed)
Patient Care Team: No Pcp Per Patient as PCP - General (General Practice)  DIAGNOSIS:  Encounter Diagnosis  Name Primary?  . Other chronic pulmonary embolism without acute cor pulmonale (HCC)    CHIEF COMPLIANT: Follow-up of history of pulmonary embolism positive lupus anticoagulant, currently pregnant on Lovenox  INTERVAL HISTORY: Angel Ho is a 30 year old with above-mentioned history of pulmonary embolism and was found to have lupus anticoagulant positivity. She is currently on Lovenox because she is pregnant. She will be due to deliver 08/10/2016. She's been tolerating Lovenox injections fairly well. She doesn't by herself.  REVIEW OF SYSTEMS:   Constitutional: Denies fevers, chills or abnormal weight loss Eyes: Denies blurriness of vision Ears, nose, mouth, throat, and face: Denies mucositis or sore throat Respiratory: Denies cough, dyspnea or wheezes Cardiovascular: Denies palpitation, chest discomfort Gastrointestinal:  Denies nausea, heartburn or change in bowel habits Skin: Denies abnormal skin rashes Lymphatics: Denies new lymphadenopathy or easy bruising Neurological:Denies numbness, tingling or new weaknesses Behavioral/Psych: Mood is stable, no new changes  Extremities: No lower extremity edema  All other systems were reviewed with the patient and are negative.  I have reviewed the past medical history, past surgical history, social history and family history with the patient and they are unchanged from previous note.  ALLERGIES:  has No Known Allergies.  MEDICATIONS:  Current Outpatient Prescriptions  Medication Sig Dispense Refill  . enoxaparin (LOVENOX) 100 MG/ML injection INJECT 1 ML (100 MG TOTAL) INTO THE SKIN EVERY 12 (TWELVE) HOURS. 60 Syringe 2  . traMADol (ULTRAM) 50 MG tablet Take 1 tablet (50 mg total) by mouth every 6 (six) hours as needed. 8 tablet 0   No current facility-administered medications for this visit.     PHYSICAL  EXAMINATION: ECOG PERFORMANCE STATUS: 1 - Symptomatic but completely ambulatory  Vitals:   05/26/16 0924  BP: 111/70  Pulse: 94  Resp: 18  Temp: 98.1 F (36.7 C)   Filed Weights   05/26/16 0924  Weight: 242 lb (109.8 kg)    GENERAL:alert, no distress and comfortable SKIN: skin color, texture, turgor are normal, no rashes or significant lesions EYES: normal, Conjunctiva are pink and non-injected, sclera clear OROPHARYNX:no exudate, no erythema and lips, buccal mucosa, and tongue normal  NECK: supple, thyroid normal size, non-tender, without nodularity LYMPH:  no palpable lymphadenopathy in the cervical, axillary or inguinal LUNGS: clear to auscultation and percussion with normal breathing effort HEART: regular rate & rhythm and no murmurs and no lower extremity edema ABDOMEN: Pregnancy seventh month MUSCULOSKELETAL:no cyanosis of digits and no clubbing  NEURO: alert & oriented x 3 with fluent speech, no focal motor/sensory deficits EXTREMITIES: No lower extremity edema  LABORATORY DATA:  I have reviewed the data as listed   Chemistry      Component Value Date/Time   NA 138 08/24/2015 2023   K 3.6 08/24/2015 2023   CL 104 08/24/2015 2023   CO2 25 08/24/2015 2023   BUN 12 08/24/2015 2023   CREATININE 0.95 08/24/2015 2023      Component Value Date/Time   CALCIUM 9.3 08/24/2015 2023   ALKPHOS 61 08/13/2015 0407   AST 17 08/13/2015 0407   ALT 16 08/13/2015 0407   BILITOT 0.3 08/13/2015 0407       Lab Results  Component Value Date   WBC 5.6 08/24/2015   HGB 12.0 08/24/2015   HCT 37.0 08/24/2015   MCV 84.9 08/24/2015   PLT 413 (H) 08/24/2015   NEUTROABS 3.7 08/13/2015  ASSESSMENT & PLAN:  Pulmonary embolism (HCC) Acute bilateral pulmonary emboli diagnosed 08/13/2015: All lobes of both lungs prominent on the right with the right heart strain, Right lower extremity DVT  Current treatment: Previously was on Xarelto 20 mg daily. She was changed to Lovenox 100  mg subcutaneous twice a day because she is currently pregnant with the plan to switch her to heparin when it gets closer to her delivery date so that she can get epidural if necessary. Patient is expected to have delivery on 08/10/2016.  Xarelto/Lovenox toxicities: Denies any bruising or bleeding  Risk factors for blood clots: I discussed the inherited and acquired risk factors for blood clots.  Recommendation:  1. Workup for inherited risk factors like prothrombin gene mutation, factor V Leiden, and protein C, protein S, antithrombin III, homocysteine 2. 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 Return to clinic in 1 year for follow-up  No orders of the defined types were placed in this encounter.  The patient has a good understanding of the overall plan. she agrees with it. she will call with any problems that may develop before the next visit here.   Sabas SousGudena, Oryn Casanova K, MD 05/26/16

## 2016-07-05 ENCOUNTER — Encounter (HOSPITAL_COMMUNITY): Payer: Self-pay | Admitting: *Deleted

## 2016-07-05 ENCOUNTER — Other Ambulatory Visit (HOSPITAL_COMMUNITY): Payer: Self-pay | Admitting: Obstetrics and Gynecology

## 2016-07-05 DIAGNOSIS — Z3A35 35 weeks gestation of pregnancy: Secondary | ICD-10-CM

## 2016-07-05 DIAGNOSIS — R9389 Abnormal findings on diagnostic imaging of other specified body structures: Secondary | ICD-10-CM

## 2016-07-05 DIAGNOSIS — Z3689 Encounter for other specified antenatal screening: Secondary | ICD-10-CM

## 2016-07-06 ENCOUNTER — Encounter (HOSPITAL_COMMUNITY): Payer: Self-pay

## 2016-07-06 ENCOUNTER — Ambulatory Visit (HOSPITAL_COMMUNITY)
Admission: RE | Admit: 2016-07-06 | Discharge: 2016-07-06 | Disposition: A | Discharge status: Home / Self Care | Payer: Managed Care, Other (non HMO) | Source: Ambulatory Visit | Attending: Obstetrics and Gynecology | Admitting: Obstetrics and Gynecology

## 2016-07-06 ENCOUNTER — Other Ambulatory Visit (HOSPITAL_COMMUNITY): Payer: Self-pay | Admitting: Obstetrics and Gynecology

## 2016-07-06 DIAGNOSIS — Z86711 Personal history of pulmonary embolism: Secondary | ICD-10-CM

## 2016-07-06 DIAGNOSIS — Z3A35 35 weeks gestation of pregnancy: Secondary | ICD-10-CM

## 2016-07-06 DIAGNOSIS — O88213 Thromboembolism in pregnancy, third trimester: Secondary | ICD-10-CM | POA: Insufficient documentation

## 2016-07-06 DIAGNOSIS — Z8759 Personal history of other complications of pregnancy, childbirth and the puerperium: Secondary | ICD-10-CM

## 2016-07-06 DIAGNOSIS — R9389 Abnormal findings on diagnostic imaging of other specified body structures: Secondary | ICD-10-CM

## 2016-07-06 DIAGNOSIS — O368390 Maternal care for abnormalities of the fetal heart rate or rhythm, unspecified trimester, not applicable or unspecified: Secondary | ICD-10-CM

## 2016-07-06 DIAGNOSIS — Z363 Encounter for antenatal screening for malformations: Secondary | ICD-10-CM

## 2016-07-06 DIAGNOSIS — O99119 Other diseases of the blood and blood-forming organs and certain disorders involving the immune mechanism complicating pregnancy, unspecified trimester: Secondary | ICD-10-CM

## 2016-07-06 DIAGNOSIS — D6851 Activated protein C resistance: Secondary | ICD-10-CM

## 2016-07-06 DIAGNOSIS — O99113 Other diseases of the blood and blood-forming organs and certain disorders involving the immune mechanism complicating pregnancy, third trimester: Secondary | ICD-10-CM | POA: Diagnosis not present

## 2016-07-06 DIAGNOSIS — Z3689 Encounter for other specified antenatal screening: Secondary | ICD-10-CM

## 2016-07-06 HISTORY — DX: Other diseases of the blood and blood-forming organs and certain disorders involving the immune mechanism complicating pregnancy, unspecified trimester: O99.119

## 2016-07-06 HISTORY — DX: Other diseases of the blood and blood-forming organs and certain disorders involving the immune mechanism complicating pregnancy, unspecified trimester: D68.51

## 2016-07-28 ENCOUNTER — Telehealth (HOSPITAL_COMMUNITY): Payer: Self-pay | Admitting: *Deleted

## 2016-07-28 NOTE — Telephone Encounter (Signed)
Preadmission screen  

## 2016-08-01 ENCOUNTER — Encounter (HOSPITAL_COMMUNITY): Payer: Self-pay | Admitting: *Deleted

## 2016-08-01 LAB — OB RESULTS CONSOLE GBS: GBS: POSITIVE

## 2016-08-02 NOTE — H&P (Signed)
[name] is a 31 y.o. female, [id number] @ 39.1 wks by sure LMP (EDD 08/10/16), presents for IOL 2/2 Factor V Leiden Mutation and h/o PE - on anticoagulation. Endorses FM and mild ctxs. Denies LOF or VB. Was closed/30%/-2 at last office visit.  Desires pp tubal.  Denies h/a, visual changes, epigastric pain, CP or difficulty breathing.   [facility] and significant for:  1) Factor V Leiden Mutation (diagnosed Aug 13, 2015 by Hematologist Dr. Pamelia HoitGudena - suffered blackout, sweating and cramping in her right leg - testing revealed clot in right leg that traveled to both lungs) - has been on Lovenox the entire pregnancy, and switched to Heparin as per Hematologist's recommendation x 1 dose, on morning of induction. However, per pt report, last injection was Wednesday night, 08/02/16 @ 9 pm. States no injection on Thursday, 08/03/16 due to not having a needle  2) Morbid obesity with BMI 40+: weekly normal antenatal testing in 3rd trimester, EFW 8 lbs 3 oz (3710 g +/- 542 g), 96% at 37 weeks with normal AFI  3) GBS positive - NKDA  4) Fetal arrhythmia - referred to MFM. Per MFM on 07/06/16 (35 wks), likely premature atrial or ventricular beats as no evidence of pericardial effusion or hydrops. EFW > 90% with AC > 97th%tile. Fetal echo scheduled same day for further eval and revealed frequent, likely premature atrial contractions, w/ no evidence of complex ectopy or tachyarrhythmia. No evidence of pericardial or pleural effusion or hydrops fetalis. Evaluation of neonate heart after delivery recommended   5) Anemia - Hbg 9.4 on 05/03/16  6) Recurrent pregnancy loss - no thrombophilia work-up per pt report   OB History    Gravida Para Term Preterm AB Living   5 1 1   3 1    SAB TAB Ectopic Multiple Live Births   3       1     SAB 2010 @ 8 wks SAB 2011 @ 8 wks SAB 2012 @ 8 wks  Pt does not recall having a thrombophilia work-up  SVB 05/01/2012, 5566w3d, 24+hrs, (SROM'd),  female infant, birthwt 7+3, Epidural, [facility] - [facility]  Past Medical History:  Diagnosis Date  . Factor V Leiden mutation affecting pregnancy (HCC)   . No pertinent past medical history    Past Surgical History:  Procedure Laterality Date  . NO PAST SURGERIES     Family History: family history includes Diabetes in her father and other. Social History:  reports that she quit smoking about 5 years ago. Her smoking use included Cigarettes. She has never used smokeless tobacco. She reports that she drinks alcohol. She reports that she does not use drugs.     Maternal Diabetes: No Genetic Screening: Normal Maternal Ultrasounds/Referrals: Abnormal:  Findings:   Other:Fetal arrhythmia Fetal Ultrasounds or other Referrals:  Fetal echo, Referred to Materal Fetal Medicine  Maternal Substance Abuse:  No Significant Maternal Medications:  Meds include: Other: Heparin 7500 units  bid, PNV Significant Maternal Lab Results:  Lab values include: Group B Strep positive Other Comments:  Declined Tdap and flu  ROS 10 Systems reviewed and are negative for acute change except as noted in the HPI.  History  BP 137/79  Exam  EFW 8 lbs 3 oz (+/- 542 g) at 37 wks; pelvis proven to 7+3  Fundal placenta Cephalic by Thayer OhmLeopold maneuvers and VE  FHRT: BL 130 w/ moderate variability, +accels, no decels Toco: q 1-7 min  Physical Exam  Nursing  note and vitals reviewed. Constitutional: She is oriented to person, place, and time. She appears well-developed and well-nourished. No distress.  HENT:  Head: Normocephalic and atraumatic.  Neck: Normal range of motion. Neck supple.  Cardiovascular: Normal rate and regular rhythm.  Exam reveals no friction rub.  No murmur heard. Respiratory: Effort normal and breath sounds normal.  GI: Soft. She exhibits no distension and no mass. There is no tenderness. There is no rebound and no guarding.  Genitourinary: Vagina normal and uterus normal.  Musculoskeletal: Normal  range of motion. She exhibits 1+ bilateral LE and pedal edema. Denies diffuse pain. No erythema, warmth or tenderness to LEs appreciated.  Neurological: She is alert and oriented to person, place, and time. She has normal reflexes.  Skin: Skin is warm and dry.  Psychiatric: She has a normal mood and affect.   Prenatal labs: ABO, Rh: --/--/AB POS (02/02 0134) Antibody: NEG (02/02 0134) Rubella: Immune (06/20 0000) RPR: Nonreactive (06/20 0000)  HBsAg: Negative (06/20 0000)  HIV: Non-reactive (06/20 0000)  GBS: Positive (01/30 0000)  Hbg 9.4 @ 28 wks Pap normal 12/01/15  Assessment: IUP at 39.1 wks IOL due to Factor V Leiden and h/o PE. Last Heparin dose 08/02/16 at 9 pm Fetal arrhythmia; tracing overall reassuring GBS positive Unfavorable cvx Morbidly obese Anemia H/O recurrent pregnancy loss Possible LGA fetus No concerns for DVT at present  Plan:  Admit to [facility]. Routine [facility] orders.  SCDs. Has f/u w/ Hematologist in November of this year. PCN G for GBS prophylaxis per standard dosing. Reviewed plan of care with patient and spouse including rationale and processes of induction, to include Cytotec, foley bulb, pitocin and AROM. Risks and benefits of induction were reviewed, including failure of method, prolonged labor, need for further intervention, and risk of cesarean. Patient and and spouse seem to understand these risks and wish to proceed. Pain med/epidural prn. Expect SVD.  Female infant for outpatient circ.   Sherre Scarlet 08/04/2016, 1:30 AM  ADDENDUM: 1st dose of Cytotec deferred until 0213 due to tachysystole. Cat 1 FHRT.  Sherre Scarlet, CNM 08/04/16, 3:00 AM

## 2016-08-03 ENCOUNTER — Other Ambulatory Visit: Payer: Self-pay | Admitting: Obstetrics & Gynecology

## 2016-08-03 DIAGNOSIS — O2620 Pregnancy care for patient with recurrent pregnancy loss, unspecified trimester: Secondary | ICD-10-CM | POA: Insufficient documentation

## 2016-08-04 ENCOUNTER — Inpatient Hospital Stay (HOSPITAL_COMMUNITY): Payer: Managed Care, Other (non HMO) | Admitting: Anesthesiology

## 2016-08-04 ENCOUNTER — Encounter (HOSPITAL_COMMUNITY): Payer: Self-pay

## 2016-08-04 ENCOUNTER — Encounter (HOSPITAL_COMMUNITY)
Admission: RE | Disposition: A | Discharge status: Home / Self Care | Payer: Self-pay | Source: Ambulatory Visit | Attending: Obstetrics & Gynecology

## 2016-08-04 ENCOUNTER — Inpatient Hospital Stay (HOSPITAL_COMMUNITY)
Admission: RE | Admit: 2016-08-04 | Discharge: 2016-08-07 | DRG: 765 | Disposition: A | Discharge status: Home / Self Care | Payer: Managed Care, Other (non HMO) | Source: Ambulatory Visit | Attending: Obstetrics & Gynecology | Admitting: Obstetrics & Gynecology

## 2016-08-04 VITALS — BP 120/77 | HR 78 | Temp 98.3°F | Resp 17 | Ht 63.0 in | Wt 252.0 lb

## 2016-08-04 DIAGNOSIS — Z3A39 39 weeks gestation of pregnancy: Secondary | ICD-10-CM

## 2016-08-04 DIAGNOSIS — Z86711 Personal history of pulmonary embolism: Secondary | ICD-10-CM | POA: Diagnosis not present

## 2016-08-04 DIAGNOSIS — Z98891 History of uterine scar from previous surgery: Secondary | ICD-10-CM

## 2016-08-04 DIAGNOSIS — O2620 Pregnancy care for patient with recurrent pregnancy loss, unspecified trimester: Secondary | ICD-10-CM

## 2016-08-04 DIAGNOSIS — O99214 Obesity complicating childbirth: Secondary | ICD-10-CM | POA: Diagnosis present

## 2016-08-04 DIAGNOSIS — Z6841 Body Mass Index (BMI) 40.0 and over, adult: Secondary | ICD-10-CM | POA: Diagnosis not present

## 2016-08-04 DIAGNOSIS — B951 Streptococcus, group B, as the cause of diseases classified elsewhere: Secondary | ICD-10-CM | POA: Diagnosis present

## 2016-08-04 DIAGNOSIS — Z86718 Personal history of other venous thrombosis and embolism: Secondary | ICD-10-CM

## 2016-08-04 DIAGNOSIS — Z833 Family history of diabetes mellitus: Secondary | ICD-10-CM

## 2016-08-04 DIAGNOSIS — Z302 Encounter for sterilization: Secondary | ICD-10-CM | POA: Diagnosis not present

## 2016-08-04 DIAGNOSIS — Z87891 Personal history of nicotine dependence: Secondary | ICD-10-CM | POA: Diagnosis not present

## 2016-08-04 DIAGNOSIS — D649 Anemia, unspecified: Secondary | ICD-10-CM | POA: Diagnosis present

## 2016-08-04 DIAGNOSIS — O99824 Streptococcus B carrier state complicating childbirth: Secondary | ICD-10-CM | POA: Diagnosis present

## 2016-08-04 DIAGNOSIS — O9912 Other diseases of the blood and blood-forming organs and certain disorders involving the immune mechanism complicating childbirth: Secondary | ICD-10-CM | POA: Diagnosis present

## 2016-08-04 DIAGNOSIS — D6851 Activated protein C resistance: Secondary | ICD-10-CM | POA: Diagnosis present

## 2016-08-04 DIAGNOSIS — O368390 Maternal care for abnormalities of the fetal heart rate or rhythm, unspecified trimester, not applicable or unspecified: Secondary | ICD-10-CM | POA: Diagnosis not present

## 2016-08-04 DIAGNOSIS — D509 Iron deficiency anemia, unspecified: Secondary | ICD-10-CM

## 2016-08-04 DIAGNOSIS — Z9079 Acquired absence of other genital organ(s): Secondary | ICD-10-CM

## 2016-08-04 DIAGNOSIS — O9902 Anemia complicating childbirth: Secondary | ICD-10-CM | POA: Diagnosis present

## 2016-08-04 DIAGNOSIS — O99119 Other diseases of the blood and blood-forming organs and certain disorders involving the immune mechanism complicating pregnancy, unspecified trimester: Secondary | ICD-10-CM | POA: Diagnosis present

## 2016-08-04 LAB — COMPREHENSIVE METABOLIC PANEL
ALBUMIN: 3.1 g/dL — AB (ref 3.5–5.0)
ALT: 23 U/L (ref 14–54)
AST: 22 U/L (ref 15–41)
Alkaline Phosphatase: 224 U/L — ABNORMAL HIGH (ref 38–126)
Anion gap: 8 (ref 5–15)
BILIRUBIN TOTAL: 1 mg/dL (ref 0.3–1.2)
CO2: 25 mmol/L (ref 22–32)
CREATININE: 0.56 mg/dL (ref 0.44–1.00)
Calcium: 8.9 mg/dL (ref 8.9–10.3)
Chloride: 102 mmol/L (ref 101–111)
GFR calc Af Amer: >60 mL/min (ref >60)
GFR calc non Af Amer: >60 mL/min (ref >60)
GLUCOSE: 84 mg/dL (ref 65–99)
POTASSIUM: 2.9 mmol/L — AB (ref 3.5–5.1)
Sodium: 135 mmol/L (ref 135–145)
TOTAL PROTEIN: 7.3 g/dL (ref 6.5–8.1)

## 2016-08-04 LAB — CBC
HEMATOCRIT: 29.3 % — AB (ref 36.0–46.0)
HEMATOCRIT: 30.9 % — AB (ref 36.0–46.0)
Hemoglobin: 10.1 g/dL — ABNORMAL LOW (ref 12.0–15.0)
Hemoglobin: 10.4 g/dL — ABNORMAL LOW (ref 12.0–15.0)
MCH: 29.1 pg (ref 26.0–34.0)
MCH: 29 pg (ref 26.0–34.0)
MCHC: 34.5 g/dL (ref 30.0–36.0)
MCHC: 33.7 g/dL (ref 30.0–36.0)
MCV: 84.4 fL (ref 78.0–100.0)
MCV: 86.1 fL (ref 78.0–100.0)
PLATELETS: 278 10*3/uL (ref 150–400)
PLATELETS: 282 10*3/uL (ref 150–400)
RBC: 3.47 MIL/uL — ABNORMAL LOW (ref 3.87–5.11)
RBC: 3.59 MIL/uL — AB (ref 3.87–5.11)
RDW: 16 % — AB (ref 11.5–15.5)
RDW: 16 % — ABNORMAL HIGH (ref 11.5–15.5)
WBC: 5.9 10*3/uL (ref 4.0–10.5)
WBC: 8.6 10*3/uL (ref 4.0–10.5)

## 2016-08-04 LAB — PROTEIN / CREATININE RATIO, URINE
Creatinine, Urine: 190 mg/dL
PROTEIN CREATININE RATIO: 0.17 mg/mg{creat} — AB (ref 0.00–0.15)
TOTAL PROTEIN, URINE: 32 mg/dL

## 2016-08-04 LAB — TYPE AND SCREEN
ABO/RH(D): AB POS
ANTIBODY SCREEN: NEGATIVE

## 2016-08-04 LAB — URIC ACID: Uric Acid, Serum: 4.8 mg/dL (ref 2.3–6.6)

## 2016-08-04 LAB — RPR: RPR Ser Ql: NONREACTIVE

## 2016-08-04 LAB — LACTATE DEHYDROGENASE: LDH: 163 U/L (ref 98–192)

## 2016-08-04 SURGERY — Surgical Case
Anesthesia: Epidural

## 2016-08-04 MED ORDER — PENICILLIN G POTASSIUM 5000000 UNITS IJ SOLR: 5.0000 10*6.[IU] | Freq: Once | INTRAVENOUS | Status: DC

## 2016-08-04 MED ORDER — TERBUTALINE SULFATE 1 MG/ML IJ SOLN
0.2500 mg | Freq: Once | INTRAMUSCULAR | Status: AC | PRN
  Administered 2016-08-04: 0.25 mg via SUBCUTANEOUS
  Filled 2016-08-04 (×2): qty 1

## 2016-08-04 MED ORDER — FENTANYL 2.5 MCG/ML BUPIVACAINE 1/10 % EPIDURAL INFUSION (WH - ANES)
14.0000 mL/h | INTRAMUSCULAR | Status: DC | PRN
  Administered 2016-08-04 (×2): 14 mL/h via EPIDURAL
  Filled 2016-08-04 (×2): qty 100

## 2016-08-04 MED ORDER — SOD CITRATE-CITRIC ACID 500-334 MG/5ML PO SOLN
30.0000 mL | ORAL | Status: DC | PRN
  Filled 2016-08-04: qty 15

## 2016-08-04 MED ORDER — OXYTOCIN 40 UNITS IN LACTATED RINGERS INFUSION - SIMPLE MED
1.0000 m[IU]/min | INTRAVENOUS | Status: DC
Start: 2016-08-04 — End: 2016-08-05
  Administered 2016-08-04: 1 m[IU]/min via INTRAVENOUS
  Filled 2016-08-04: qty 1000

## 2016-08-04 MED ORDER — OXYTOCIN 40 UNITS IN LACTATED RINGERS INFUSION - SIMPLE MED: 2.5000 [IU]/h | INTRAVENOUS | Status: DC

## 2016-08-04 MED ORDER — PROMETHAZINE HCL 25 MG/ML IJ SOLN
12.5000 mg | Freq: Four times a day (QID) | INTRAMUSCULAR | Status: DC | PRN
  Administered 2016-08-04: 12.5 mg via INTRAVENOUS
  Filled 2016-08-04: qty 1

## 2016-08-04 MED ORDER — FLEET ENEMA 7-19 GM/118ML RE ENEM: 1.0000 | ENEMA | RECTAL | Status: DC | PRN

## 2016-08-04 MED ORDER — TERBUTALINE SULFATE 1 MG/ML IJ SOLN
0.2500 mg | Freq: Once | INTRAMUSCULAR | Status: AC | PRN
  Administered 2016-08-04: 0.25 mg via SUBCUTANEOUS

## 2016-08-04 MED ORDER — LIDOCAINE-EPINEPHRINE (PF) 2 %-1:200000 IJ SOLN
INTRAMUSCULAR | Status: AC
  Filled 2016-08-04: qty 20

## 2016-08-04 MED ORDER — ONDANSETRON HCL 4 MG/2ML IJ SOLN
4.0000 mg | Freq: Four times a day (QID) | INTRAMUSCULAR | Status: DC | PRN
  Administered 2016-08-04: 4 mg via INTRAVENOUS

## 2016-08-04 MED ORDER — EPHEDRINE 5 MG/ML INJ: 10.0000 mg | INTRAVENOUS | Status: DC | PRN

## 2016-08-04 MED ORDER — OXYTOCIN 10 UNIT/ML IJ SOLN
INTRAVENOUS | Status: DC | PRN
  Administered 2016-08-04: 40 [IU] via INTRAVENOUS

## 2016-08-04 MED ORDER — POTASSIUM CHLORIDE 2 MEQ/ML IV SOLN
30.0000 meq | Freq: Once | INTRAVENOUS | Status: AC
  Administered 2016-08-04: 30 meq via INTRAVENOUS
  Filled 2016-08-04: qty 15

## 2016-08-04 MED ORDER — OXYTOCIN 10 UNIT/ML IJ SOLN
INTRAMUSCULAR | Status: AC
  Filled 2016-08-04: qty 4

## 2016-08-04 MED ORDER — SODIUM BICARBONATE 8.4 % IV SOLN
INTRAVENOUS | Status: DC | PRN
  Administered 2016-08-04 (×2): 5 mL via EPIDURAL

## 2016-08-04 MED ORDER — LACTATED RINGERS IV SOLN
INTRAVENOUS | Status: DC | PRN
  Administered 2016-08-04: via INTRAVENOUS

## 2016-08-04 MED ORDER — PHENYLEPHRINE 40 MCG/ML (10ML) SYRINGE FOR IV PUSH (FOR BLOOD PRESSURE SUPPORT)
80.0000 ug | PREFILLED_SYRINGE | INTRAVENOUS | Status: DC | PRN
  Administered 2016-08-04: 80 ug via INTRAVENOUS

## 2016-08-04 MED ORDER — PENICILLIN G POTASSIUM 5000000 UNITS IJ SOLR
5.0000 10*6.[IU] | Freq: Once | INTRAVENOUS | Status: AC
  Administered 2016-08-04: 5 10*6.[IU] via INTRAVENOUS
  Filled 2016-08-04: qty 5

## 2016-08-04 MED ORDER — PHENYLEPHRINE 40 MCG/ML (10ML) SYRINGE FOR IV PUSH (FOR BLOOD PRESSURE SUPPORT)
80.0000 ug | PREFILLED_SYRINGE | INTRAVENOUS | Status: DC | PRN
  Filled 2016-08-04 (×2): qty 10

## 2016-08-04 MED ORDER — LIDOCAINE HCL (PF) 1 % IJ SOLN
INTRAMUSCULAR | Status: DC | PRN
  Administered 2016-08-04: 3 mL via EPIDURAL
  Administered 2016-08-04: 2 mL via EPIDURAL
  Administered 2016-08-04: 5 mL via EPIDURAL

## 2016-08-04 MED ORDER — LACTATED RINGERS IV SOLN
INTRAVENOUS | Status: DC | PRN
  Administered 2016-08-04 (×2): via INTRAVENOUS

## 2016-08-04 MED ORDER — HYDRALAZINE HCL 20 MG/ML IJ SOLN: 10.0000 mg | Freq: Once | INTRAMUSCULAR | Status: DC | PRN

## 2016-08-04 MED ORDER — LACTATED RINGERS IV SOLN
500.0000 mL | INTRAVENOUS | Status: DC | PRN
  Administered 2016-08-04: 500 mL via INTRAVENOUS

## 2016-08-04 MED ORDER — SODIUM BICARBONATE 8.4 % IV SOLN
INTRAVENOUS | Status: AC
  Filled 2016-08-04: qty 50

## 2016-08-04 MED ORDER — OXYTOCIN BOLUS FROM INFUSION: 500.0000 mL | Freq: Once | INTRAVENOUS | Status: DC

## 2016-08-04 MED ORDER — ONDANSETRON HCL 4 MG/2ML IJ SOLN
INTRAMUSCULAR | Status: AC
  Filled 2016-08-04: qty 2

## 2016-08-04 MED ORDER — PHENYLEPHRINE 40 MCG/ML (10ML) SYRINGE FOR IV PUSH (FOR BLOOD PRESSURE SUPPORT)
PREFILLED_SYRINGE | INTRAVENOUS | Status: AC
  Filled 2016-08-04: qty 10

## 2016-08-04 MED ORDER — LACTATED RINGERS IV SOLN: 500.0000 mL | Freq: Once | INTRAVENOUS | Status: DC

## 2016-08-04 MED ORDER — LACTATED RINGERS IV SOLN
INTRAVENOUS | Status: DC
  Administered 2016-08-04 (×2): via INTRAVENOUS

## 2016-08-04 MED ORDER — PENICILLIN G POT IN DEXTROSE 60000 UNIT/ML IV SOLN
3.0000 10*6.[IU] | INTRAVENOUS | Status: DC
  Administered 2016-08-04 (×3): 3 10*6.[IU] via INTRAVENOUS
  Filled 2016-08-04 (×8): qty 50

## 2016-08-04 MED ORDER — MISOPROSTOL 25 MCG QUARTER TABLET
25.0000 ug | ORAL_TABLET | ORAL | Status: DC | PRN
  Administered 2016-08-04: 25 ug via VAGINAL
  Filled 2016-08-04: qty 0.25

## 2016-08-04 MED ORDER — BUTORPHANOL TARTRATE 1 MG/ML IJ SOLN
INTRAMUSCULAR | Status: AC
  Administered 2016-08-04: 1 mg via INTRAVENOUS
  Filled 2016-08-04: qty 1

## 2016-08-04 MED ORDER — CEFAZOLIN SODIUM-DEXTROSE 2-3 GM-% IV SOLR
INTRAVENOUS | Status: DC | PRN
  Administered 2016-08-04: 2 g via INTRAVENOUS

## 2016-08-04 MED ORDER — LABETALOL HCL 5 MG/ML IV SOLN: 20.0000 mg | INTRAVENOUS | Status: DC | PRN

## 2016-08-04 MED ORDER — MORPHINE SULFATE (PF) 0.5 MG/ML IJ SOLN
INTRAMUSCULAR | Status: AC
  Filled 2016-08-04: qty 10

## 2016-08-04 MED ORDER — OXYTOCIN 40 UNITS IN LACTATED RINGERS INFUSION - SIMPLE MED: 1.0000 m[IU]/min | INTRAVENOUS | Status: DC

## 2016-08-04 MED ORDER — DIPHENHYDRAMINE HCL 50 MG/ML IJ SOLN: 12.5000 mg | INTRAMUSCULAR | Status: DC | PRN

## 2016-08-04 MED ORDER — LIDOCAINE HCL (PF) 1 % IJ SOLN: 30.0000 mL | INTRAMUSCULAR | Status: DC | PRN

## 2016-08-04 MED ORDER — BUTORPHANOL TARTRATE 1 MG/ML IJ SOLN
1.0000 mg | INTRAMUSCULAR | Status: DC | PRN
  Administered 2016-08-04: 1 mg via INTRAVENOUS

## 2016-08-04 MED ORDER — FENTANYL CITRATE (PF) 100 MCG/2ML IJ SOLN: 50.0000 ug | INTRAMUSCULAR | Status: DC | PRN

## 2016-08-04 MED ORDER — ACETAMINOPHEN 325 MG PO TABS: 650.0000 mg | ORAL_TABLET | ORAL | Status: DC | PRN

## 2016-08-04 SURGICAL SUPPLY — 39 items
BENZOIN TINCTURE PRP APPL 2/3 (GAUZE/BANDAGES/DRESSINGS) ×3 IMPLANT
CHLORAPREP W/TINT 26ML (MISCELLANEOUS) ×3 IMPLANT
CLAMP CORD UMBIL (MISCELLANEOUS) IMPLANT
CLOSURE WOUND 1/2 X4 (GAUZE/BANDAGES/DRESSINGS) ×1
CLOTH BEACON ORANGE TIMEOUT ST (SAFETY) ×3 IMPLANT
CONTAINER PREFILL 10% NBF 15ML (MISCELLANEOUS) IMPLANT
DRSG OPSITE 11X17.75 LRG (GAUZE/BANDAGES/DRESSINGS) ×3 IMPLANT
DRSG OPSITE POSTOP 4X10 (GAUZE/BANDAGES/DRESSINGS) ×3 IMPLANT
ELECT REM PT RETURN 9FT ADLT (ELECTROSURGICAL) ×3
ELECTRODE REM PT RTRN 9FT ADLT (ELECTROSURGICAL) ×1 IMPLANT
EXTRACTOR VACUUM M CUP 4 TUBE (SUCTIONS) IMPLANT
EXTRACTOR VACUUM M CUP 4' TUBE (SUCTIONS)
GLOVE BIO SURGEON STRL SZ7.5 (GLOVE) ×3 IMPLANT
GLOVE BIOGEL PI IND STRL 7.0 (GLOVE) ×1 IMPLANT
GLOVE BIOGEL PI IND STRL 7.5 (GLOVE) ×1 IMPLANT
GLOVE BIOGEL PI INDICATOR 7.0 (GLOVE) ×2
GLOVE BIOGEL PI INDICATOR 7.5 (GLOVE) ×2
GOWN STRL REUS W/TWL LRG LVL3 (GOWN DISPOSABLE) ×6 IMPLANT
KIT ABG SYR 3ML LUER SLIP (SYRINGE) IMPLANT
NEEDLE HYPO 25X5/8 SAFETYGLIDE (NEEDLE) IMPLANT
NS IRRIG 1000ML POUR BTL (IV SOLUTION) ×3 IMPLANT
PACK C SECTION WH (CUSTOM PROCEDURE TRAY) ×3 IMPLANT
PAD OB MATERNITY 4.3X12.25 (PERSONAL CARE ITEMS) ×3 IMPLANT
PENCIL SMOKE EVAC W/HOLSTER (ELECTROSURGICAL) ×3 IMPLANT
RTRCTR C-SECT PINK 25CM LRG (MISCELLANEOUS) ×3 IMPLANT
STRIP CLOSURE SKIN 1/2X4 (GAUZE/BANDAGES/DRESSINGS) ×2 IMPLANT
SUT CHROMIC 2 0 CT 1 (SUTURE) ×3 IMPLANT
SUT MNCRL AB 3-0 PS2 27 (SUTURE) ×6 IMPLANT
SUT PDS AB 0 CT1 27 (SUTURE) ×6 IMPLANT
SUT PLAIN 2 0 XLH (SUTURE) ×3 IMPLANT
SUT VIC AB 0 CT1 36 (SUTURE) ×6 IMPLANT
SUT VIC AB 0 CTX 36 (SUTURE) ×8
SUT VIC AB 0 CTX36XBRD ANBCTRL (SUTURE) ×4 IMPLANT
SUT VIC AB 2-0 SH 27 (SUTURE) ×8
SUT VIC AB 2-0 SH 27XBRD (SUTURE) ×4 IMPLANT
SUT VIC AB 3-0 SH 27 (SUTURE) ×2
SUT VIC AB 3-0 SH 27X BRD (SUTURE) ×1 IMPLANT
TOWEL OR 17X24 6PK STRL BLUE (TOWEL DISPOSABLE) ×3 IMPLANT
TRAY FOLEY CATH SILVER 14FR (SET/KITS/TRAYS/PACK) ×3 IMPLANT

## 2016-08-04 NOTE — Progress Notes (Signed)
Subjective: Denies h/a, visual changes, epigastric pain, CP or difficulty breathing. Breathing through ctxs - rates pain 7-8/10. Declines pain medication at this time.  Objective: BP (!) 147/88 (BP Location: Right Arm)   Pulse 75   Temp 98.3 F (36.8 C) (Oral)   Resp 20   Ht 5\' 3"  (1.6 m)   Wt 114.3 kg (252 lb)   LMP 11/04/2015   BMI 44.64 kg/m   Today's Vitals   08/04/16 0045 08/04/16 0404 08/04/16 0421  BP: 137/79 (!) 144/91 (!) 147/88  Pulse: 90 80 75  Resp: 18  20  Temp: 98.7 F (37.1 C)  98.3 F (36.8 C)  TempSrc: Oral  Oral  Weight: 114.3 kg (252 lb)    Height: 5\' 3"  (1.6 m)     Results for orders placed or performed during the hospital encounter of 08/04/16 (from the past 24 hour(s))  CBC     Status: Abnormal   Collection Time: 08/04/16  1:34 AM  Result Value Ref Range   WBC 5.9 4.0 - 10.5 K/uL   RBC 3.47 (L) 3.87 - 5.11 MIL/uL   Hemoglobin 10.1 (L) 12.0 - 15.0 g/dL   HCT 16.129.3 (L) 09.636.0 - 04.546.0 %   MCV 84.4 78.0 - 100.0 fL   MCH 29.1 26.0 - 34.0 pg   MCHC 34.5 30.0 - 36.0 g/dL   RDW 40.916.0 (H) 81.111.5 - 91.415.5 %   Platelets 278 150 - 400 K/uL  Type and screen     Status: None   Collection Time: 08/04/16  1:34 AM  Result Value Ref Range   ABO/RH(D) AB POS    Antibody Screen NEG    Sample Expiration 08/07/2016   Comprehensive metabolic panel     Status: Abnormal   Collection Time: 08/04/16  4:47 AM  Result Value Ref Range   Sodium 135 135 - 145 mmol/L   Potassium 2.9 (L) 3.5 - 5.1 mmol/L   Chloride 102 101 - 111 mmol/L   CO2 25 22 - 32 mmol/L   Glucose, Bld 84 65 - 99 mg/dL   BUN <5 (L) 6 - 20 mg/dL   Creatinine, Ser 7.820.56 0.44 - 1.00 mg/dL   Calcium 8.9 8.9 - 95.610.3 mg/dL   Total Protein 7.3 6.5 - 8.1 g/dL   Albumin 3.1 (L) 3.5 - 5.0 g/dL   AST 22 15 - 41 U/L   ALT 23 14 - 54 U/L   Alkaline Phosphatase 224 (H) 38 - 126 U/L   Total Bilirubin 1.0 0.3 - 1.2 mg/dL   GFR calc non Af Amer >60 >60 mL/min   GFR calc Af Amer >60 >60 mL/min   Anion gap 8 5 - 15   Lactate dehydrogenase     Status: None   Collection Time: 08/04/16  4:47 AM  Result Value Ref Range   LDH 163 98 - 192 U/L  Uric acid     Status: None   Collection Time: 08/04/16  4:47 AM  Result Value Ref Range   Uric Acid, Serum 4.8 2.3 - 6.6 mg/dL  Protein / creatinine ratio, urine     Status: Abnormal   Collection Time: 08/04/16  4:50 AM  Result Value Ref Range   Creatinine, Urine 190.00 mg/dL   Total Protein, Urine 32 mg/dL   Protein Creatinine Ratio 0.17 (H) 0.00 - 0.15 mg/mg[Cre]    FHT: Category 1 UC:   regular, every 2 minutes SVE: Deferred   Assessment/Plan:  - Elevated BPs - suspect pain related, but  preE labs checked as a precaution. Labetalol for severe range pressures. Pt aware and in agreement. She denies ever having issues with her BP. BPs q 2 hrs for now. - Hypokalemia (2.9) - Denies muscle weakness, diarrhea, but has been vomiting over the past 1-2 wks. Will replace via IV initially.   Sherre Scarlet CNM 08/04/2016, 5:50 AM  ADDENDUM: Normal preE labs and PCR. Results shared w/ pt.  Sherre Scarlet, CNM 08/04/16, 6:20 AM

## 2016-08-04 NOTE — Progress Notes (Signed)
  Subjective: Comfortable with epidural, "a little shaky".  Objective: BP 133/85   Pulse 99   Temp 97.9 F (36.6 C) (Oral)   Resp 17   Ht 5\' 3"  (1.6 m)   Wt 114.3 kg (252 lb)   LMP 11/04/2015   SpO2 98%   BMI 44.64 kg/m  No intake/output data recorded. Total I/O In: -  Out: 1400 [Urine:1400]   Vitals:   08/04/16 1700 08/04/16 1730 08/04/16 1800 08/04/16 1820  BP: 124/81 134/82 133/85   Pulse: 91 93 99   Resp:  18  17  Temp:    97.9 F (36.6 C)  TempSrc:    Oral  SpO2:      Weight:      Height:        FHT: Category 1--audible arrythmia noted intermittently UC:   regular, every 2-3 minutes SVE:   Dilation: 5 Effacement (%): 60 Station: -2 Exam by:: Rickesha Veracruz cnm  Vtx still high, but well-applied to cervix, BBOW. Fetus feels slightly asynclitic AROM--clear fluid. IUPC and FSE applied. Pitocin at 3 mu/min--decreased from 5 mu due to frequent UCs  SCDs in place  Assessment:  IUP at 39 1/7 weeks Induction for Factor V Leiden/DVT-PE, on Lovenox until 36 weeks, last heparin 08/02/16 at 9pm GBS positive Hypokalemia--s/p run of 30 mEq Possible LGA--EFW 8+3, 96%ile at 37 weeks FHR arrythmia--negative MFM w/u Morbid obesity Anemia--Hgb 10.1 today Initial BP elevations, normotensive now, negative PIH evaluation.  Plan: Continue pitocin to establish/maintain adequacy Position to facilitate rotation/descent Recheck K+ pp. Continue to monitor BP Dr. Sallye OberKulwa updated.  Nigel BridgemanLATHAM, Keaton Beichner CNM 08/04/2016, 6:28 PM

## 2016-08-04 NOTE — Anesthesia Preprocedure Evaluation (Addendum)
Anesthesia Evaluation  Patient identified by MRN, date of birth, ID band Patient awake    Reviewed: Allergy & Precautions, NPO status , Patient's Chart, lab work & pertinent test results  Airway Mallampati: II  TM Distance: >3 FB Neck ROM: Full    Dental  (+) Teeth Intact, Dental Advisory Given   Pulmonary former smoker, PE   Pulmonary exam normal breath sounds clear to auscultation       Cardiovascular + DVT  Normal cardiovascular exam Rhythm:Regular Rate:Normal     Neuro/Psych negative neurological ROS  negative psych ROS   GI/Hepatic negative GI ROS, Neg liver ROS,   Endo/Other  Morbid obesity  Renal/GU negative Renal ROS     Musculoskeletal negative musculoskeletal ROS (+)   Abdominal   Peds  Hematology  (+) Blood dyscrasia (Factor V Leiden; heparin stopped 08/02/16), anemia , Plt 282k   Anesthesia Other Findings Day of surgery medications reviewed with the patient.  Reproductive/Obstetrics (+) Pregnancy                             Anesthesia Physical Anesthesia Plan  ASA: III and emergent  Anesthesia Plan: Epidural   Post-op Pain Management:    Induction:   Airway Management Planned: Natural Airway  Additional Equipment:   Intra-op Plan:   Post-operative Plan:   Informed Consent: I have reviewed the patients History and Physical, chart, labs and discussed the procedure including the risks, benefits and alternatives for the proposed anesthesia with the patient or authorized representative who has indicated his/her understanding and acceptance.   Dental advisory given  Plan Discussed with:   Anesthesia Plan Comments: (Patient identified. Risks/Benefits/Options discussed with patient including but not limited to bleeding, infection, nerve damage, paralysis, failed block, incomplete pain control, headache, blood pressure changes, nausea, vomiting, reactions to medication  both or allergic, itching and postpartum back pain. Confirmed with bedside nurse the patient's most recent platelet count. Confirmed with patient that they are not currently taking any anticoagulation, have any bleeding history or any family history of bleeding disorders. Patient expressed understanding and wished to proceed. All questions were answered.    Patient for urgent C/section for fetal intolerance to labor. Will use epidural for C/Section. M. Malen GauzeFoster, MD)       Anesthesia Quick Evaluation

## 2016-08-04 NOTE — Anesthesia Procedure Notes (Signed)
Epidural Patient location during procedure: OB Start time: 08/04/2016 12:07 PM End time: 08/04/2016 12:13 PM  Staffing Anesthesiologist: Cecile HearingURK, STEPHEN EDWARD Performed: anesthesiologist   Preanesthetic Checklist Completed: patient identified, pre-op evaluation, timeout performed, IV checked, risks and benefits discussed and monitors and equipment checked  Epidural Patient position: sitting Prep: DuraPrep Patient monitoring: blood pressure and continuous pulse ox Approach: midline Location: L3-L4 Injection technique: LOR air  Needle:  Needle type: Tuohy  Needle gauge: 17 G Needle length: 9 cm Needle insertion depth: 8 cm Catheter size: 19 Gauge Catheter at skin depth: 13 cm Test dose: negative and Other (1% Lidocaine)  Additional Notes Patient identified.  Risk benefits discussed including failed block, incomplete pain control, headache, nerve damage, paralysis, blood pressure changes, nausea, vomiting, reactions to medication both toxic or allergic, and postpartum back pain.  Patient expressed understanding and wished to proceed.  All questions were answered.  Sterile technique used throughout procedure and epidural site dressed with sterile barrier dressing. No paresthesia or other complications noted. The patient did not experience any signs of intravascular injection such as tinnitus or metallic taste in mouth nor signs of intrathecal spread such as rapid motor block. Please see nursing notes for vital signs. Reason for block:procedure for pain

## 2016-08-04 NOTE — Progress Notes (Signed)
Trellis MomentRasheema Swallows MRN: [id number]  Subjective: -Care assumed of 31 y.o. G5P1031 at 1542w1d who presents for IOL for Factor V Leiden Mutation and H/O PE. In room to meet acquaintance of patient and family.  Patient reports intermittent rectal pressure.     Objective: BP 115/70   Pulse 96   Temp 97.7 F (36.5 C) (Oral)   Resp 16   Ht 5\' 3"  (1.6 m)   Wt 114.3 kg (252 lb)   LMP 11/04/2015   SpO2 98%   BMI 44.64 kg/m  I/O last 3 completed shifts: In: -  Out: 1400 [Urine:1400] No intake/output data recorded.  Fetal Monitoring: FHT: 135 bpm, Mod Var, -Decels, +Accels UC: Q1-603min, palpates moderate to strong, MVUs 200mmHg    Vaginal Exam: SVE:   Dilation: 5 Effacement (%): 60 Station: -2 Exam by:: J.Fernando Stoiber, CNM Membranes:AROM  Internal Monitors: IUPC and FSE in place and functional  Augmentation/Induction: Pitocin:664mUn/min  Cytotec: S/P 1 Dose  Assessment:  IUP at 39.1wks Cat I FT  Factor V Leiden H/O PE Fetal Arrythmia   Plan: -Discussed plan for infant after deliver-Fetal Echo per patient -Will decide on need of NICU team at time of delivery-if appropriate -Educated on when to report rectal pressure -Place on peanut to promote fetal rotation and descent -Continue other mgmt as ordered  Valma CavaJessica L Rowen Hur,MSN, CNM 08/04/2016, 7:46 PM

## 2016-08-04 NOTE — Anesthesia Pain Management Evaluation Note (Signed)
  [facility] Pain Management Visit Note  Patient: Angel Ho, 31 y.o., female  "Hello I am a [facility] of the anesthesia team at [facility]. We have an anesthesia team available at all times to provide care throughout the hospital, including epidural management and anesthesia for C-section. I don't know your plan for the delivery whether it a natural birth, water birth, IV sedation, nitrous supplementation, doula or epidural, but we want to meet your pain goals."   1.Was your pain managed to your expectations on prior hospitalizations?   Yes   2.What is your expectation for pain management during this hospitalization?     Epidural  3.How can we help you reach that goal? unsure  Record the patient's initial score and the patient's pain goal.   Pain: 8  Pain Goal: 9 [facility] wants you to be able to say your pain was always managed very well.  Cephus ShellingBURGER,[name] 08/04/2016

## 2016-08-04 NOTE — Progress Notes (Signed)
  Subjective: Comfortable with epidural.  Objective: BP 126/79   Pulse 85   Temp 98.6 F (37 C) (Oral)   Resp 18   Ht 5\' 3"  (1.6 m)   Wt 114.3 kg (252 lb)   LMP 11/04/2015   SpO2 98%   BMI 44.64 kg/m  No intake/output data recorded. No intake/output data recorded.   Vitals:   08/04/16 1230 08/04/16 1235 08/04/16 1240 08/04/16 1245  BP: 132/84 131/80 129/81 126/79  Pulse: 87 84 83 85  Resp: 17 18  18   Temp:      TempSrc:      SpO2: 98% 98% 98%   Weight:      Height:        FHT: Category 1 UC:   regular, every *2-3 minutes SVE:   Dilation: 3 Effacement (%): Thick Station: -3 Exam by:: Telesia Ates cnm  Cervix still very thick--external os is 3 cm, internal os FT Pitocin at 4 mu/min SCDs in place  Assessment:  Induction for Factor V/hx DVT, PE GBS positive Latent labor  Plan: Continue current care. Recheck prn  Nigel BridgemanLATHAM, Lavella Myren CNM 08/04/2016, 1:14 PM

## 2016-08-04 NOTE — Progress Notes (Signed)
Trellis MomentRasheema Duley MRN: [id number]  Subjective: -Nurse call requests review of strip.  Instructed to decrease pitocin from 668mUn/min to 236mUn/min.  Late decels turn into prolonged decelerations.  In room to assess.   Objective: BP 93/62   Pulse (!) 117   Temp 97.8 F (36.6 C) (Oral)   Resp 18   Ht 5\' 3"  (1.6 m)   Wt 114.3 kg (252 lb)   LMP 11/04/2015   SpO2 98%   BMI 44.64 kg/m  I/O last 3 completed shifts: In: -  Out: 1400 [Urine:1400] Total I/O In: -  Out: 700 [Urine:700]  Fetal Monitoring: FHT: 130 bpm, Mod Var, +Prolonged Decels, +Accels UC: Palpates strong    Vaginal Exam: SVE:   Dilation: 7 Effacement (%): 90 Station: -2 Exam by:: J.Edwena Mayorga, CNM Membranes:AROM  Internal Monitors: IUPC and FSE  Augmentation/Induction: Pitocin:598mUn/min to off Cytotec: S/P  Assessment:  IUP at 39.1wks Cat II FT  IOL  Plan: -Pitocin discontinued -Fetal Resuscitative Measures +O2 via NR mask +LR bolus +Position change -Dr. Alinda SierrasAR consulted and advised recommendation for primary c/s -Patient informed of doctor recommendation.  Q/C addressed -R/B reviewed including, but not limited to, infection, bleeding, pain, damage to organs or fetus resulting in need for additional surgery.  Patient understands and accepts these risks and wishes to proceed with c/s. -Give terbutaline -Prep for c/s per protocol -Dr. Alinda SierrasAR en route  Joyce CopaJessica L Anuj Summons,MSN, CNM 08/04/2016, 11:05 PM

## 2016-08-04 NOTE — Progress Notes (Addendum)
  Subjective: Received Stadol at 0717, s/p foley placement by Sherre ScarletKimberly Williams, CNM, at 7am, with benefit.  Family at bedside.  Patient denies HA, visual sx, or epigastric pain.  Objective: BP (!) 150/90 (BP Location: Right Arm)   Pulse 96   Temp 98.7 F (37.1 C) (Oral)   Resp 18   Ht 5\' 3"  (1.6 m)   Wt 114.3 kg (252 lb)   LMP 11/04/2015   BMI 44.64 kg/m  No intake/output data recorded. No intake/output data recorded.   Vitals:   08/04/16 0404 08/04/16 0421 08/04/16 0627 08/04/16 0734  BP: (!) 144/91 (!) 147/88 (!) 150/90 (!) 150/90  Pulse: 80 75 82 96  Resp:  20 20 18   Temp:  98.3 F (36.8 C)  98.7 F (37.1 C)  TempSrc:  Oral  Oral  Weight:      Height:        FHT: Category 1 UC:   regular, every 3 minutes SVE:   Dilation: 1 Effacement (%): Thick Station: -3 Exam by:: Williams CNM  Foley placed at 7am SCDs in place  K+ replacement infusing.  Received [employer] at 0213, with UCs starting after administration.  Assessment:  IUP at 39 1/7 weeks Induction for Factor V Leiden, hx DVT and PE 08/2015--on Lovenox during pregnancy. Last heparin dose 08/02/16 at 9pm. GBS positive Morbid obesity FHR arrythmia--PACs on MFM evaluation Anemia--Hgb 10.1 today Gestational hypertension--negative PIH labs Category 1 FHR Hypokalemia--2.9 on admission. Possible LGA--EFW 8+3, 96%ile, at 37 weeks.  Plan: Reviewed status with Dr. Sallye OberKulwa. Start PCN now for GBS prophylaxis Await foley extrusion Pitocin with foley extrusion or if spontaneous UCs abate. Hold K+ infusion while PCN infusing. Labetalol IV for BP parameters   Alquan Morrish CNM 08/04/2016, 8:13 AM

## 2016-08-04 NOTE — Progress Notes (Signed)
  Subjective: Tearful w/ ctxs. Amenable to IV pain medication.  Objective: BP (!) 150/90 (BP Location: Right Arm)   Pulse 96   Temp 98.7 F (37.1 C) (Oral)   Resp 18   Ht 5\' 3"  (1.6 m)   Wt 114.3 kg (252 lb)   LMP 11/04/2015   BMI 44.64 kg/m   FHT: BL 125 w/ moderate variability, +accels, no decels - occasional audible arrhythmia UC:   regular, every 1 minute SVE:   Dilation: 1 Effacement (%): Thick Station: -3 Exam by:: Mayford KnifeWilliams CNM @ 06:47  Cvx soft  Terb sq x 1 given at 07:00 AM due to tachysystole   Intracervical balloon placed via speculum at 06:47. Balloon filled w/ 40 ml of normal saline slowly, then traction applied.  Assessment:  IUP at 39.1 wks IOL 2/2 Factor V Leiden mutation and h/o PE Cat 1 FHRT - occasional audible arrhythmia SCDs on and functioning GBS positive Morbidly obese Hypokalemia  Plan: Will start PCN per Dr. Sallye OberKulwa. IV pain medication. Expect progress and SVD. Sign out given to V. Emilee HeroLatham, CNM   Ho, Angel Ricardo CNM 08/04/2016, 07:20 AM

## 2016-08-04 NOTE — Progress Notes (Signed)
  Subjective: Foley extruded at [phone].    Objective: BP 131/78   Pulse 84   Temp 98.7 F (37.1 C) (Oral)   Resp 18   Ht 5\' 3"  (1.6 m)   Wt 114.3 kg (252 lb)   LMP 11/04/2015   BMI 44.64 kg/m  No intake/output data recorded. No intake/output data recorded.   Vitals:   08/04/16 0627 08/04/16 0734 08/04/16 0833 08/04/16 0901  BP: (!) 150/90 (!) 150/90 131/88 131/78  Pulse: 82 96 90 84  Resp: 20 18 18 18   Temp:  98.7 F (37.1 C)    TempSrc:  Oral    Weight:      Height:        FHT: Category 1 UC:   irregular, every 1-4 minutes SVE:   Dilation: 3 Effacement (%): Thick Station: -3 Exam by:: Gram Siedlecki cnm  Cervix very soft  Assessment:  Induction for Factor V/DVT/PE, on Lovenox during pregnancy GBS positive  Plan: Start pitocin. Await descent of vtx for AROM--plan internal monitoring at that time 1st dose PCN infused. K+ run restarted. Desires pp tubal--does not need prior consent form. Epidural/pain med prn.  Jazmeen Axtell CNM 08/04/2016, 10:14 AM

## 2016-08-05 ENCOUNTER — Encounter (HOSPITAL_COMMUNITY): Payer: Self-pay | Admitting: Obstetrics and Gynecology

## 2016-08-05 DIAGNOSIS — Z98891 History of uterine scar from previous surgery: Secondary | ICD-10-CM

## 2016-08-05 DIAGNOSIS — Z9079 Acquired absence of other genital organ(s): Secondary | ICD-10-CM

## 2016-08-05 LAB — CBC
HEMATOCRIT: 27.9 % — AB (ref 36.0–46.0)
Hemoglobin: 9.4 g/dL — ABNORMAL LOW (ref 12.0–15.0)
MCH: 28.8 pg (ref 26.0–34.0)
MCHC: 33.7 g/dL (ref 30.0–36.0)
MCV: 85.6 fL (ref 78.0–100.0)
Platelets: 234 10*3/uL (ref 150–400)
RBC: 3.26 MIL/uL — ABNORMAL LOW (ref 3.87–5.11)
RDW: 15.9 % — AB (ref 11.5–15.5)
WBC: 10.6 10*3/uL — ABNORMAL HIGH (ref 4.0–10.5)

## 2016-08-05 LAB — CREATININE, SERUM
Creatinine, Ser: 0.62 mg/dL (ref 0.44–1.00)
GFR calc Af Amer: >60 mL/min (ref >60)

## 2016-08-05 MED ORDER — NALBUPHINE HCL 10 MG/ML IJ SOLN: 5.0000 mg | INTRAMUSCULAR | Status: DC | PRN

## 2016-08-05 MED ORDER — OXYCODONE HCL 5 MG PO TABS
10.0000 mg | ORAL_TABLET | ORAL | Status: DC | PRN
  Administered 2016-08-07 (×2): 10 mg via ORAL
  Filled 2016-08-05 (×2): qty 2

## 2016-08-05 MED ORDER — MEPERIDINE HCL 25 MG/ML IJ SOLN
INTRAMUSCULAR | Status: AC
  Filled 2016-08-05: qty 1

## 2016-08-05 MED ORDER — PRENATAL MULTIVITAMIN CH
1.0000 | ORAL_TABLET | Freq: Every day | ORAL | Status: DC
  Administered 2016-08-05 – 2016-08-06 (×2): 1 via ORAL
  Filled 2016-08-05 (×2): qty 1

## 2016-08-05 MED ORDER — IBUPROFEN 600 MG PO TABS: 600.0000 mg | ORAL_TABLET | Freq: Four times a day (QID) | ORAL | Status: DC | PRN

## 2016-08-05 MED ORDER — SCOPOLAMINE 1 MG/3DAYS TD PT72
MEDICATED_PATCH | TRANSDERMAL | Status: AC
  Filled 2016-08-05: qty 1

## 2016-08-05 MED ORDER — MORPHINE SULFATE (PF) 0.5 MG/ML IJ SOLN
INTRAMUSCULAR | Status: DC | PRN
  Administered 2016-08-05: 4 mg via EPIDURAL

## 2016-08-05 MED ORDER — DIBUCAINE 1 % RE OINT: 1.0000 "application " | TOPICAL_OINTMENT | RECTAL | Status: DC | PRN

## 2016-08-05 MED ORDER — ONDANSETRON HCL 4 MG/2ML IJ SOLN: 4.0000 mg | Freq: Three times a day (TID) | INTRAMUSCULAR | Status: DC | PRN

## 2016-08-05 MED ORDER — SIMETHICONE 80 MG PO CHEW: 80.0000 mg | CHEWABLE_TABLET | ORAL | Status: DC | PRN

## 2016-08-05 MED ORDER — SODIUM CHLORIDE 0.9 % IV SOLN
INTRAVENOUS | Status: DC | PRN
  Administered 2016-08-05: 1000 mL via INTRAMUSCULAR

## 2016-08-05 MED ORDER — DIPHENHYDRAMINE HCL 25 MG PO CAPS
25.0000 mg | ORAL_CAPSULE | ORAL | Status: DC | PRN
  Filled 2016-08-05: qty 1

## 2016-08-05 MED ORDER — DIPHENHYDRAMINE HCL 25 MG PO CAPS: 25.0000 mg | ORAL_CAPSULE | Freq: Four times a day (QID) | ORAL | Status: DC | PRN

## 2016-08-05 MED ORDER — IBUPROFEN 600 MG PO TABS
600.0000 mg | ORAL_TABLET | Freq: Four times a day (QID) | ORAL | Status: DC
  Administered 2016-08-05 – 2016-08-07 (×8): 600 mg via ORAL
  Filled 2016-08-05 (×8): qty 1

## 2016-08-05 MED ORDER — OXYCODONE HCL 5 MG PO TABS
5.0000 mg | ORAL_TABLET | ORAL | Status: DC | PRN
  Administered 2016-08-06 (×2): 5 mg via ORAL
  Filled 2016-08-05 (×2): qty 1

## 2016-08-05 MED ORDER — SIMETHICONE 80 MG PO CHEW
80.0000 mg | CHEWABLE_TABLET | ORAL | Status: DC
  Administered 2016-08-05 – 2016-08-07 (×2): 80 mg via ORAL
  Filled 2016-08-05 (×2): qty 1

## 2016-08-05 MED ORDER — SIMETHICONE 80 MG PO CHEW
80.0000 mg | CHEWABLE_TABLET | Freq: Three times a day (TID) | ORAL | Status: DC
  Administered 2016-08-05 – 2016-08-07 (×6): 80 mg via ORAL
  Filled 2016-08-05 (×5): qty 1

## 2016-08-05 MED ORDER — ENOXAPARIN SODIUM 40 MG/0.4ML ~~LOC~~ SOLN: 40.0000 mg | Freq: Two times a day (BID) | SUBCUTANEOUS | Status: DC

## 2016-08-05 MED ORDER — COCONUT OIL OIL: 1.0000 "application " | TOPICAL_OIL | Status: DC | PRN

## 2016-08-05 MED ORDER — KETOROLAC TROMETHAMINE 30 MG/ML IJ SOLN
INTRAMUSCULAR | Status: AC
  Filled 2016-08-05: qty 1

## 2016-08-05 MED ORDER — MEPERIDINE HCL 25 MG/ML IJ SOLN
INTRAMUSCULAR | Status: DC | PRN
  Administered 2016-08-05 (×2): 12.5 mg via INTRAVENOUS

## 2016-08-05 MED ORDER — DIPHENHYDRAMINE HCL 50 MG/ML IJ SOLN
12.5000 mg | INTRAMUSCULAR | Status: DC | PRN
Start: 2016-08-05 — End: 2016-08-05

## 2016-08-05 MED ORDER — ACETAMINOPHEN 500 MG PO TABS: 1000.0000 mg | ORAL_TABLET | Freq: Four times a day (QID) | ORAL | Status: DC

## 2016-08-05 MED ORDER — MEPERIDINE HCL 25 MG/ML IJ SOLN: 6.2500 mg | INTRAMUSCULAR | Status: DC | PRN

## 2016-08-05 MED ORDER — NALOXONE HCL 2 MG/2ML IJ SOSY
1.0000 ug/kg/h | PREFILLED_SYRINGE | INTRAVENOUS | Status: DC | PRN
  Filled 2016-08-05: qty 2

## 2016-08-05 MED ORDER — KETOROLAC TROMETHAMINE 30 MG/ML IJ SOLN
30.0000 mg | Freq: Four times a day (QID) | INTRAMUSCULAR | Status: DC | PRN
  Administered 2016-08-05: 30 mg via INTRAMUSCULAR

## 2016-08-05 MED ORDER — KETOROLAC TROMETHAMINE 30 MG/ML IJ SOLN
30.0000 mg | Freq: Four times a day (QID) | INTRAMUSCULAR | Status: DC | PRN
Start: 2016-08-05 — End: 2016-08-05

## 2016-08-05 MED ORDER — TETANUS-DIPHTH-ACELL PERTUSSIS 5-2.5-18.5 LF-MCG/0.5 IM SUSP
0.5000 mL | Freq: Once | INTRAMUSCULAR | Status: DC
Start: 2016-08-06 — End: 2016-08-07

## 2016-08-05 MED ORDER — ZOLPIDEM TARTRATE 5 MG PO TABS: 5.0000 mg | ORAL_TABLET | Freq: Every evening | ORAL | Status: DC | PRN

## 2016-08-05 MED ORDER — SODIUM CHLORIDE 0.9% FLUSH
3.0000 mL | INTRAVENOUS | Status: DC | PRN
Start: 2016-08-05 — End: 2016-08-05

## 2016-08-05 MED ORDER — SENNOSIDES-DOCUSATE SODIUM 8.6-50 MG PO TABS
2.0000 | ORAL_TABLET | ORAL | Status: DC
  Administered 2016-08-05 – 2016-08-07 (×2): 2 via ORAL
  Filled 2016-08-05 (×2): qty 2

## 2016-08-05 MED ORDER — NALBUPHINE HCL 10 MG/ML IJ SOLN: 5.0000 mg | Freq: Once | INTRAMUSCULAR | Status: DC | PRN

## 2016-08-05 MED ORDER — OXYTOCIN 40 UNITS IN LACTATED RINGERS INFUSION - SIMPLE MED: 2.5000 [IU]/h | INTRAVENOUS | Status: AC

## 2016-08-05 MED ORDER — NALOXONE HCL 0.4 MG/ML IJ SOLN: 0.4000 mg | INTRAMUSCULAR | Status: DC | PRN

## 2016-08-05 MED ORDER — LACTATED RINGERS IV SOLN
INTRAVENOUS | Status: DC
  Administered 2016-08-05: 09:00:00 via INTRAVENOUS

## 2016-08-05 MED ORDER — FENTANYL CITRATE (PF) 100 MCG/2ML IJ SOLN: 25.0000 ug | INTRAMUSCULAR | Status: DC | PRN

## 2016-08-05 MED ORDER — ACETAMINOPHEN 325 MG PO TABS
650.0000 mg | ORAL_TABLET | ORAL | Status: DC | PRN
  Administered 2016-08-05 – 2016-08-06 (×4): 650 mg via ORAL
  Filled 2016-08-05 (×4): qty 2

## 2016-08-05 MED ORDER — ENOXAPARIN SODIUM 60 MG/0.6ML ~~LOC~~ SOLN
60.0000 mg | SUBCUTANEOUS | Status: DC
  Administered 2016-08-05 – 2016-08-06 (×2): 60 mg via SUBCUTANEOUS
  Filled 2016-08-05 (×2): qty 0.6

## 2016-08-05 MED ORDER — WITCH HAZEL-GLYCERIN EX PADS: 1.0000 "application " | MEDICATED_PAD | CUTANEOUS | Status: DC | PRN

## 2016-08-05 MED ORDER — MENTHOL 3 MG MT LOZG: 1.0000 | LOZENGE | OROMUCOSAL | Status: DC | PRN

## 2016-08-05 NOTE — Op Note (Signed)
Cesarean Section Procedure Note  Indications: P1 at 28 2/7wks with fetal intolerance of labor and desires sterilization  Pre-operative Diagnosis: 1.39 2/7wks 2.Fetal intolerance of labor 3.Sterilization   Post-operative Diagnosis: 1.39 2/7wks 2.Fetal intolerance of labor 3.Sterilization  Procedure: PRIMARY LOW TRANSVERSE CESAREAN SECTION  Surgeon: Osborn Coho, MD    ssistants: Gerrit Heck, CNM  Anesthesia: Regional  Anesthesiologist: Mal Amabile, MD   Procedure Details  The patient was taken to the operating room after the risks, benefits, complications, treatment options, and expected outcomes were discussed with the patient.  The patient concurred with the proposed plan, giving informed consent which was signed and witnessed. The patient was taken to Operating Room One, identified as Angel Ho and the procedure verified as C-Section Delivery. A Time Out was held and the above information confirmed.  After induction of anesthesia by obtaining a surgical level via the spinal, the patient was prepped and draped in the usual sterile manner. A Pfannenstiel skin incision was made and carried down through the subcutaneous tissue to the underlying layer of fascia.  The fascia was incised bilaterally and extended transversely bilaterally with the Mayo scissors. Kocher clamps were placed on the inferior aspect of the fascial incision and the underlying rectus muscle was separated from the fascia. The same was done on the superior aspect of the fascial incision.  The peritoneum was identified, entered bluntly and extended manually. An Alexis self-retaining retractor was placed.  The utero-vesical peritoneal reflection was incised transversely and the bladder flap was bluntly freed from the lower uterine segment. A low transverse uterine incision was made with the scalpel and extended bilaterally with the bandage scissors.  The infant was delivered in vertex position without difficulty.  After the umbilical cord was clamped and cut, the infant was handed to the awaiting pediatricians.  Cord blood was obtained for evaluation.  The placenta was removed intact and appeared to be within normal limits. The uterus was cleared of all clots and debris. The uterine incision was closed with running interlocking sutures of 0 Vicryl and a second imbricating layer was performed as well incorporating the bladder flap.  Another stitch of 0 vicryl was placed at the left aspect of the incision for hemostasis.  Bilateral tubes and ovaries appeared to be within normal limits.  Good hemostasis was noted.  Copious irrigation was performed until clear.  The uterus was exteriorized and the left fallopian tube grasped at the fimbriated end and with another babcock near the isthmic portion of the tube.  Two kelly clamps were placed sequentially along the tube.  The tube was sutured with 2-0 vicryl at each kelly clamp, the sutured as a union with 2-0 vicryl then ligated with 2-0 vicryl.  The same was done on the contralateral side.  The pedicle was cauterized with the bovie.  The uterus was returned to the intraabdominal cavity.  Copious irrigation was performed.  Good hemostasis was noted of the pedicles and the primary uterine incision.  A laparotomy sponge had been placed in the abdominal cavity prior to exteriorizing the uterus.  After returning the uterus to the intraabdominal cavity, the sponge was removed.  The peritoneum was repaired with 2-0 chromic via a running suture.  The fascia was reapproximated with a running suture of 0 Vicryl. The subcutaneous tissue was reapproximated with 3 interrupted sutures of 2-0 plain.  The skin was reapproximated with a subcuticular suture of 3-0 monocryl.  Steristrips were applied.  Instrument, sponge, and needle counts were correct prior to  abdominal closure and at the conclusion of the case.  The patient was awaiting transfer to the recovery room in good  condition.  Findings: Live female infant with Apgars 2 at one minute and 7 at five minutes 9 at ten minutes.  Normal appearing bilateral ovaries and fallopian tubes were noted.  Estimated Blood Loss:  1000 ml         Drains: foley to gravity 100 cc         Total IV Fluids: 2300 ml         Specimens to Pathology: Placenta and Bilateral Portion of Tubes         Complications:  None; patient tolerated the procedure well.         Disposition: PACU - hemodynamically stable.         Condition: stable  Attending Attestation: I performed the procedure.

## 2016-08-05 NOTE — Anesthesia Postprocedure Evaluation (Signed)
Anesthesia Post Note  Patient: Angel Ho  Procedure(s) Performed: Procedure(s) (LRB): CESAREAN SECTION (N/A)  Patient location during evaluation: PACU Anesthesia Type: Epidural Level of consciousness: oriented and awake and alert Pain management: pain level controlled Vital Signs Assessment: post-procedure vital signs reviewed and stable Respiratory status: spontaneous breathing, respiratory function stable and nonlabored ventilation Cardiovascular status: blood pressure returned to baseline and stable Postop Assessment: no headache, no backache, epidural receding, no signs of nausea or vomiting and patient able to bend at knees Anesthetic complications: no        Last Vitals:  Vitals:   08/05/16 0128 08/05/16 0129  BP:    Pulse: 97 91  Resp: 19 13  Temp:      Last Pain:  Vitals:   08/05/16 0053  TempSrc: Oral  PainSc:    Pain Goal: Patients Stated Pain Goal: 0 (08/04/16 2156)               Belal Scallon A.

## 2016-08-05 NOTE — Transfer of Care (Signed)
Immediate Anesthesia Transfer of Care Note  Patient: Angel Ho  Procedure(s) Performed: Procedure(s): CESAREAN SECTION (N/A)  Patient Location: PACU  Anesthesia Type:Epidural  Level of Consciousness: awake, alert  and oriented  Airway & Oxygen Therapy: Patient Spontanous Breathing  Post-op Assessment: Report given to RN and Post -op Vital signs reviewed and stable  Post vital signs: Reviewed and stable  Last Vitals:  Vitals:   08/04/16 2156 08/04/16 2231  BP:  93/62  Pulse:  (!) 117  Resp:    Temp: 36.6 C     Last Pain:  Vitals:   08/04/16 2156  TempSrc: Oral  PainSc: 7       Patients Stated Pain Goal: 0 (08/04/16 2156)  Complications: No apparent anesthesia complications

## 2016-08-05 NOTE — Addendum Note (Signed)
Addendum  created 08/05/16 0840 by Junious SilkMelinda Vick Filter, CRNA   Charge Capture section accepted, Sign clinical note

## 2016-08-05 NOTE — Lactation Note (Signed)
This note was copied from a baby's chart. Lactation Consultation Note  Patient Name: Angel Ho ZOXWR'UToday's Date: 08/05/2016 Reason for consult: Initial assessment Breastfeeding consultation services and support information given to mom.  Newborn is 8714 hours old and has been to breast once and formula fed once.  Mom states she desires to do both.  Reviewed supply and demand and stressed importance of always putting baby to breast with feeding cues.  Encouraged to call out for assist/concerns prn.  Maternal Data    Feeding    LATCH Score/Interventions                      Lactation Tools Discussed/Used     Consult Status Consult Status: Follow-up Date: 08/06/16 Follow-up type: In-patient    Huston FoleyMOULDEN, Dequann Vandervelden S 08/05/2016, 1:55 PM

## 2016-08-05 NOTE — Anesthesia Postprocedure Evaluation (Signed)
Anesthesia Post Note  Patient: Angel Ho  Procedure(s) Performed: Procedure(s) (LRB): CESAREAN SECTION (N/A)  Patient location during evaluation: Mother Baby Anesthesia Type: Epidural Level of consciousness: awake and alert Pain management: pain level controlled Vital Signs Assessment: post-procedure vital signs reviewed and stable Respiratory status: spontaneous breathing, nonlabored ventilation and respiratory function stable Cardiovascular status: stable Postop Assessment: no headache, no backache and epidural receding Anesthetic complications: no        Last Vitals:  Vitals:   08/05/16 0535 08/05/16 0633  BP: (!) 123/58 117/83  Pulse: 60 73  Resp: 14 18  Temp: 36.6 C 36.6 C    Last Pain:  Vitals:   08/05/16 0633  TempSrc: Oral  PainSc:    Pain Goal: Patients Stated Pain Goal: 0 (08/04/16 2156)               Junious SilkGILBERT,[name]

## 2016-08-05 NOTE — Progress Notes (Addendum)
Subjective: Postpartum Day 1: Cesarean Delivery fetal intolerance of labor and desires sterilization. Patient reports incisional pain, tolerating PO and + flatus.  Foley draining adequate output.  Up x 1.    Objective: Vital signs in last 24 hours: Temp:  [97.7 F (36.5 C)-98.4 F (36.9 C)] 98.1 F (36.7 C) (02/03 1006) Pulse Rate:  [60-117] 77 (02/03 1006) Resp:  [13-30] 20 (02/03 1006) BP: (86-143)/(56-118) 124/69 (02/03 1006) SpO2:  [95 %-100 %] 98 % (02/03 1006)  Physical Exam:  General: alert, cooperative and no distress Lochia: appropriate Uterine Fundus: firm Incision: covered with honey comb, no drainage noted DVT Evaluation: No evidence of DVT seen on physical exam. Negative Homan's sign. Pulsatile stockings on.    Recent Labs  08/04/16 1059 08/05/16 0607  HGB 10.4* 9.4*  HCT 30.9* 27.9*    Assessment/Plan: Status post Cesarean section. Doing well postoperatively.  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 Continue current care. Will start Lovenox at 12 hours.  Is on prophylactic dose of Lovenox per Dr. Su Hiltoberts discussion with hematology.  Dr. Sallye OberKulwa to call and confirm dosage.  Henderson Newcomerancy Jean Prothero 08/05/2016, 12:29 PM  ADDENDUM:  I saw and examined patient ad agree with above findings, assessment and plan as per CNM Prothero.  I discussed with RN, remove foley soon.  Patient encouraged to get out of bed and ambulate.  Patient had a PE in Feb 10th 2017.  I discussed case with hematology- Dr. Bertis RuddyGorsuch who recommends that patient continue with prophylactic dose of heparin for now until discharge at which time patient may resume therapeutic dose LMWH and have patient follow up with Dr. Pamelia HoitGudena within 1 week for further evaluation and management.    Dr. Sallye OberKulwa. 08/05/2016.

## 2016-08-06 NOTE — Progress Notes (Signed)
Subjective: Postpartum Day 2: Cesarean Delivery with BTL, Patient reports tolerating PO and + flatus.  Ambulating in room.  Taking meds for pain relief.  Breastfeeding going well.  Objective: Vital signs in last 24 hours: Temp:  [97.4 F (36.3 C)-98.9 F (37.2 C)] 97.8 F (36.6 C) (02/04 0525) Pulse Rate:  [56-84] 82 (02/04 0525) Resp:  [16-20] 16 (02/04 0525) BP: (117-132)/(64-78) 132/78 (02/04 0525) SpO2:  [97 %-100 %] 99 % (02/03 2326)  Physical Exam:  General: alert, cooperative and no distress Lochia: appropriate Uterine Fundus: firm Incision: dressing intact DVT Evaluation: No evidence of DVT seen on physical exam. Negative Homan's sign.   Recent Labs  08/04/16 1059 08/05/16 0607  HGB 10.4* 9.4*  HCT 30.9* 27.9*    Assessment/Plan: Status post Cesarean section. Doing well postoperatively.  Discharge home with standard precautions and return to clinic in 4-6 weeks Encourage pt to ambulate more.  Will increase Lovenox at d/c to therapeutic dose.  Will see hematology in one week.  Pt to make appt.  Henderson Newcomerancy Jean Prothero 08/06/2016, 11:21 AM

## 2016-08-06 NOTE — Lactation Note (Signed)
This note was copied from a baby's chart. Lactation Consultation Note  Patient Name: Boy Trellis MomentRasheema Kolton WUJWJ'XToday's Date: 08/06/2016 Reason for consult: Follow-up assessment   Follow up with mom of 42 hour old infant. Mom reports she has been bottle feeding infant formula. She reports he will not stay latched to the breast. She requested a pump to try to get EBM for infant. DEBP set up with instructions for use on initiate setting, assembling, disassembling and cleaning of pump parts. Enc mom to pump every 3 hours and what to expect with pumping. Enc mom to feed EBM prior to formula for feedings. Mom has a PIS at home for use. Mom without further questions. Infant currently asleep, enc mom to call out for latch assistance prn.    Maternal Data Formula Feeding for Exclusion: Yes Reason for exclusion: Mother's choice to formula and breast feed on admission Has patient been taught Hand Expression?: Yes  Feeding    LATCH Score/Interventions                      Lactation Tools Discussed/Used Pump Review: Setup, frequency, and cleaning Initiated by:: Noralee StainSharon Chasitty Hehl, RN, IBCLC Date initiated:: 08/06/16   Consult Status Consult Status: Follow-up Date: 08/07/16 Follow-up type: In-patient    Silas FloodSharon S Willies Laviolette 08/06/2016, 6:01 PM

## 2016-08-07 ENCOUNTER — Telehealth: Payer: Self-pay | Admitting: Hematology and Oncology

## 2016-08-07 ENCOUNTER — Encounter (HOSPITAL_COMMUNITY): Payer: Self-pay

## 2016-08-07 MED ORDER — ENOXAPARIN SODIUM 100 MG/ML ~~LOC~~ SOLN: 100.0000 mg | Freq: Two times a day (BID) | SUBCUTANEOUS | 2 refills | Status: DC

## 2016-08-07 MED ORDER — PRENATAL MULTIVITAMIN CH: 1.0000 | ORAL_TABLET | Freq: Every day | ORAL | 6 refills | Status: DC

## 2016-08-07 MED ORDER — DOCUSATE SODIUM 50 MG PO CAPS: 50.0000 mg | ORAL_CAPSULE | Freq: Two times a day (BID) | ORAL | 2 refills | Status: DC

## 2016-08-07 MED ORDER — OXYCODONE-ACETAMINOPHEN 5-325 MG PO TABS: 1.0000 | ORAL_TABLET | Freq: Four times a day (QID) | ORAL | 0 refills | Status: DC | PRN

## 2016-08-07 NOTE — Discharge Instructions (Signed)
Iron-Rich Diet  Introduction Iron is a mineral that helps your body to produce hemoglobin. Hemoglobin is a protein in your red blood cells that carries oxygen to your body's tissues. Eating too little iron may cause you to feel weak and tired, and it can increase your risk for infection. Eating enough iron is necessary for your body's metabolism, muscle function, and nervous system. Iron is naturally found in many foods. It can also be added to foods or fortified in foods. There are two types of dietary iron:  Heme iron. Heme iron is absorbed by the body more easily than nonheme iron. Heme iron is found in meat, poultry, and fish.  Nonheme iron. Nonheme iron is found in dietary supplements, iron-fortified grains, beans, and vegetables. You may need to follow an iron-rich diet if:  You have been diagnosed with iron deficiency or iron-deficiency anemia.  You have a condition that prevents you from absorbing dietary iron, such as:  Infection in your intestines.  Celiac disease. This involves long-lasting (chronic) inflammation of your intestines.  You do not eat enough iron.  You eat a diet that is high in foods that impair iron absorption.  You have lost a lot of blood.  You have heavy bleeding during your menstrual cycle.  You are pregnant. What is my plan? Your health care provider may help you to determine how much iron you need per day based on your condition. Generally, when a person consumes sufficient amounts of iron in the diet, the following iron needs are met:  Men.  58-30 years old: 11 mg per day.  39-5 years old: 8 mg per day.  Women.  51-53 years old: 15 mg per day.  97-38 years old: 18 mg per day.  Over 4 years old: 8 mg per day.  Pregnant women: 27 mg per day.  Breastfeeding women: 9 mg per day. What do I need to know about an iron-rich diet?  Eat fresh fruits and vegetables that are high in vitamin C along with foods that are high in iron. This will  help increase the amount of iron that your body absorbs from food, especially with foods containing nonheme iron. Foods that are high in vitamin C include oranges, peppers, tomatoes, and mango.  Take iron supplements only as directed by your health care provider. Overdose of iron can be life-threatening. If you were prescribed iron supplements, take them with orange juice or a vitamin C supplement.  Cook foods in pots and pans that are made from iron.  Eat nonheme iron-containing foods alongside foods that are high in heme iron. This helps to improve your iron absorption.  Certain foods and drinks contain compounds that impair iron absorption. Avoid eating these foods in the same meal as iron-rich foods or with iron supplements. These include:  Coffee, black tea, and red wine.  Milk, dairy products, and foods that are high in calcium.  Beans, soybeans, and peas.  Whole grains.  When eating foods that contain both nonheme iron and compounds that impair iron absorption, follow these tips to absorb iron better.  Soak beans overnight before cooking.  Soak whole grains overnight and drain them before using.  Ferment flours before baking, such as using yeast in bread dough. What foods can I eat? Grains  Iron-fortified breakfast cereal. Iron-fortified whole-wheat bread. Enriched rice. Sprouted grains. Vegetables  Spinach. Potatoes with skin. Green peas. Broccoli. Red and green bell peppers. Fermented vegetables. Fruits  Prunes. Raisins. Oranges. Strawberries. Mango. Grapefruit. Meats and Other  Protein Sources  Beef liver. Oysters. Beef. Shrimp. [place]. Chicken. Mellette. Sardines. Chickpeas. Nuts. Tofu. Beverages  Tomato juice. Fresh orange juice. Prune juice. Hibiscus tea. Fortified instant breakfast shakes. Condiments  Tahini. Fermented soy sauce. Sweets and Desserts  Black-strap molasses. Other  Wheat germ. The items listed above may not be a complete list of recommended foods or  beverages. Contact your dietitian for more options.  What foods are not recommended? Grains  Whole grains. Bran cereal. Bran flour. Oats. Vegetables  Artichokes. Brussels sprouts. Kale. Fruits  Blueberries. Raspberries. Strawberries. Figs. Meats and Other Protein Sources  Soybeans. Products made from soy protein. Dairy  Milk. Cream. Cheese. Yogurt. Cottage cheese. Beverages  Coffee. Black tea. Red wine. Sweets and Desserts  Cocoa. Chocolate. Ice cream. Other  Basil. Oregano. Parsley. The items listed above may not be a complete list of foods and beverages to avoid. Contact your dietitian for more information.  This information is not intended to replace advice given to you by your health care provider. Make sure you discuss any questions you have with your health care provider. Document Released: 01/31/2005 Document Revised: 01/07/2016 Document Reviewed: 01/14/2014  2017 Elsevier Postpartum Care After Cesarean Delivery The period of time right after you deliver your newborn is called the postpartum period. What kind of medical care will I receive?  You may continue to receive fluids and medicines through an IV tube inserted into one of your veins.  You may have small, flexible tube (catheter) draining urine from your bladder into a bag outside of your body. The catheter will be removed as soon as possible.  You may be given a squirt bottle to use when you go to the bathroom. You may use this until you are comfortable wiping as usual. To use the squirt bottle, follow these steps:  Before you urinate, fill the squirt bottle with warm water. The water should be warm. Do not use hot water.  After you urinate, while you are sitting on the toilet, use the squirt bottle to rinse the area around your urethra and vaginal opening. This rinses away any urine and blood.  You may do this instead of wiping. As you start healing, you may use the squirt bottle before wiping yourself. Make sure  to wipe gently.  Fill the squirt bottle with clean water every time you use the bathroom.  You will be given sanitary pads to wear.  Your incision will be monitored to make sure it is healing properly. You will be told when it is safe for your stitches, staples, or skin adhesive tape to be removed. What can I expect?  You may not feel the need to urinate for several hours after delivery.  You will have some soreness and pain in your abdomen. You may have a small amount of blood or clear fluid coming from your incision.  If you are breastfeeding, you may have uterine contractions every time you breastfeed for up to several weeks postpartum. Uterine contractions help your uterus return to its normal size.  It is normal to have vaginal bleeding (lochia) after delivery. The amount and appearance of lochia is often similar to a menstrual period in the first week after delivery. It will gradually decrease over the next few weeks to a dry, yellow-brown discharge. For most women, lochia stops completely by 6-8 weeks after delivery. Vaginal bleeding can vary from woman to woman.  Within the first few days after delivery, you may have breast engorgement. This is when your breasts  feel heavy, full, and uncomfortable. Your breasts may also throb and feel hard, tightly stretched, warm, and tender. After this occurs, you may have milk leaking from your breasts.Your health care provider can help you relieve discomfort due to breast engorgement. Breast engorgement should go away within a few days.  You may feel more sad or worried than normal due to hormonal changes after delivery. These feelings should not last more than a few days. If these feelings do not go away after several days, speak with your health care provider. How should I care for myself?  Tell your health care provider if you have pain or discomfort.  Drink enough water to keep your urine clear or pale yellow.  Wash your hands thoroughly  with soap and water for at least 20 seconds after changing your sanitary pads or using the toilet, and before holding or feeding your baby.  If you are not breastfeeding, avoid touching your breasts a lot. Doing this can make your breasts produce more milk.  If you become weak or lightheaded, or you feel like you might faint, ask for help before:  Getting out of bed.  Showering.  Change your sanitary pads frequently. Watch for any changes in your flow, such as a sudden increase in volume, a change in color, or the passing of large blood clots. If you pass a blood clot from your vagina, save it to show to your health care provider. Do not flush blood clots down the toilet without having your health care provider look at them.  Make sure that all your vaccinations are up to date. This can help protect you and your baby from getting certain diseases. You may need to have immunizations done before you leave the hospital.  If desired, talk with your health care provider about methods of family planning or birth control (contraception). How can I start bonding with my baby? Spending as much time as possible with your baby is very important. During this time, you and your baby can get to know each other and develop a bond. Having your baby stay with you in your room (rooming in) can give you time to get to know your baby. Rooming in can also help you become comfortable caring for your baby. Breastfeeding can also help you bond with your baby. How can I plan for returning home with my baby?  Make sure that you have a car seat installed in your vehicle.  Your car seat should be checked by a certified car seat installer to make sure that it is installed safely.  Make sure that your baby fits into the car seat safely.  Ask your health care provider any questions you have about caring for yourself or your baby. Make sure that you are able to contact your health care provider with any questions after  leaving the hospital. This information is not intended to replace advice given to you by your health care provider. Make sure you discuss any questions you have with your health care provider. Document Released: 03/13/2012 Document Revised: 11/22/2015 Document Reviewed: 05/24/2015 Elsevier Interactive Patient Education  2017 [employer].

## 2016-08-07 NOTE — Telephone Encounter (Signed)
Pt called to sch follow up appt due to just having a baby and being on blood thinners. Gave pt next available date/time per request

## 2016-08-07 NOTE — Discharge Summary (Signed)
OB Discharge Summary     Patient Name: Angel MomentRasheema Lemelin DOB: 1985-11-01 MRN: [id number]  Date of admission: 08/04/2016 Delivering MD: Osborn CohoOBERTS, ANGELA   Date of discharge: 08/07/2016  Admitting diagnosis: 3643w1d, Induction Intrauterine pregnancy: 7182w4d     Secondary diagnosis:  Principal Problem:   S/P cesarean section Active Problems:   Iron deficiency anemia   Positive GBS test   Fetal arrhythmia affecting pregnancy, antepartum   Factor V Leiden mutation affecting pregnancy (HCC)   Morbid obesity with BMI of 40.0-44.9, adult (HCC)   Status post bilateral salpingectomy  Additional problems: None     Discharge diagnosis: Term Pregnancy Delivered and S/P Primary C/S                                                                                                Post partum procedures:postpartum tubal ligation  Augmentation: None-IOL  Complications: None  Hospital course:  Induction of Labor With Cesarean Section  31 y.o. yo [id number] at 4282w4d was admitted to the hospital 08/04/2016 for induction of labor. Patient had a labor course significant for GBS treatment, cytotec, Potassium infusion, foley bulb placement, and pitocin usage. The patient went for cesarean section due to Non-Reassuring FHR, and delivered a Viable infant at 2236 on Aug 04, 2016. Membrane Rupture Time/Date: )6:15 PM ,08/04/2016   Details of operation can be found in separate operative Note.  Patient had an uncomplicated postpartum course. She is ambulating, tolerating a regular diet, passing flatus, and urinating well.  Patient is discharged home in stable condition on 08/07/16.                                    Physical exam  Vitals:   08/05/16 2326 08/06/16 0525 08/06/16 1900 08/07/16 0528  BP: 131/77 132/78 127/83 120/77  Pulse: 84 82 82 78  Resp: 18 16 18 17   Temp: 97.9 F (36.6 C) 97.8 F (36.6 C) 98.5 F (36.9 C) 98.3 F (36.8 C)  TempSrc: Oral Oral Oral Oral  SpO2: 99%  100%   Weight:      Height:        General: alert, cooperative and no distress  Chest: HRRR, Lungs CTA Skin: Warm, Dry Lochia: appropriate Uterine Fundus: firm, At Umbilicus Incision: Dressing is clean, dry, and intact DVT Evaluation: Calf/Ankle edema is present Labs: Lab Results  Component Value Date   WBC 10.6 (H) 08/05/2016   HGB 9.4 (L) 08/05/2016   HCT 27.9 (L) 08/05/2016   MCV 85.6 08/05/2016   PLT 234 08/05/2016   CMP Latest Ref Rng & Units 08/05/2016  Glucose 65 - 99 mg/dL -  BUN 6 - 20 mg/dL -  Creatinine 1.470.44 - 8.291.00 mg/dL 5.620.62  Sodium 130135 - 865145 mmol/L -  Potassium 3.5 - 5.1 mmol/L -  Chloride 101 - 111 mmol/L -  CO2 22 - 32 mmol/L -  Calcium 8.9 - 10.3 mg/dL -  Total Protein 6.5 - 8.1 g/dL -  Total Bilirubin 0.3 - 1.2 mg/dL -  Alkaline Phos 38 -  126 U/L -  AST 15 - 41 U/L -  ALT 14 - 54 U/L -    Discharge instruction: per After Visit Summary and "Baby and Me Booklet". Incision care, Postpartum Depression, Activity Restrictions, Breastfeeding, Pain Mgmt, Who and when to call for PP Concerns, Follow Up Appointment, Information Sheet Given Iron Rich Diet, Care after C/S.    After visit meds:  Allergies as of 08/07/2016   No Known Allergies     Medication List    STOP taking these medications   Heparin Sodium (Porcine) PF 5000 UNIT/0.5ML Soln     TAKE these medications   docusate sodium 50 MG capsule Commonly known as:  COLACE Take 1 capsule (50 mg total) by mouth 2 (two) times daily. As instructed   enoxaparin 100 MG/ML injection Commonly known as:  LOVENOX Inject 1 mL (100 mg total) into the skin every 12 (twelve) hours.   oxyCODONE-acetaminophen 5-325 MG tablet Commonly known as:  ROXICET Take 1-2 tablets by mouth every 6 (six) hours as needed.   prenatal multivitamin Tabs tablet Take 1 tablet by mouth daily at 12 noon. What changed:  Another medication with the same name was added. Make sure you understand how and when to take each.   prenatal multivitamin Tabs tablet Take 1  tablet by mouth daily at 12 noon. What changed:  You were already taking a medication with the same name, and this prescription was added. Make sure you understand how and when to take each.       Diet: routine diet  Activity: Advance as tolerated. Pelvic rest for 6 weeks.   Outpatient follow up:6 weeks Follow up Appt:Future Appointments Date Time Provider Department Center  05/25/2017 8:15 AM Serena Croissant, MD [facility] None   Follow up Visit:No Follow-up on file.  Postpartum contraception: Tubal Ligation  Newborn Data: Live born female-Jabari Birth Weight: 8 lb 15.2 oz (4060 g) APGAR: 2, 7  Baby Feeding: Breast Disposition:home with mother   08/07/2016 Cherre Robins, CNM

## 2016-08-08 ENCOUNTER — Encounter (HOSPITAL_COMMUNITY): Payer: Self-pay

## 2016-08-10 ENCOUNTER — Ambulatory Visit (HOSPITAL_BASED_OUTPATIENT_CLINIC_OR_DEPARTMENT_OTHER): Payer: Managed Care, Other (non HMO) | Admitting: Hematology and Oncology

## 2016-08-10 ENCOUNTER — Inpatient Hospital Stay (HOSPITAL_COMMUNITY)
Admission: AD | Admit: 2016-08-10 | Payer: Managed Care, Other (non HMO) | Source: Ambulatory Visit | Admitting: Obstetrics and Gynecology

## 2016-08-10 ENCOUNTER — Encounter: Payer: Self-pay | Admitting: Hematology and Oncology

## 2016-08-10 ENCOUNTER — Telehealth: Payer: Self-pay | Admitting: Hematology and Oncology

## 2016-08-10 DIAGNOSIS — D6862 Lupus anticoagulant syndrome: Secondary | ICD-10-CM | POA: Diagnosis not present

## 2016-08-10 DIAGNOSIS — I82401 Acute embolism and thrombosis of unspecified deep veins of right lower extremity: Secondary | ICD-10-CM

## 2016-08-10 DIAGNOSIS — Z7901 Long term (current) use of anticoagulants: Secondary | ICD-10-CM | POA: Diagnosis not present

## 2016-08-10 DIAGNOSIS — I2782 Chronic pulmonary embolism: Secondary | ICD-10-CM

## 2016-08-10 NOTE — Progress Notes (Signed)
Patient Care Team: No Pcp Per Patient as PCP - General (General Practice)  DIAGNOSIS:  Encounter Diagnosis  Name Primary?  . Other chronic pulmonary embolism without acute cor pulmonale (HCC)     CHIEF COMPLIANT: Follow-up after delivery with antiphospholipid antibody syndrome  INTERVAL HISTORY: Angel Ho is a 31 year old with above-mentioned history of pulmonary embolism who was found to have lupus anticoagulant testing positive. She was on Lovenox for pregnancy and recently had a C-section a week ago. She did not have any excessive bleeding problems. She is here one week post C-section and appears to be doing quite well. She went back to taking 100 mg twice a day of Lovenox. She is currently breast-feeding. She now has a baby son a week old named Angel Ho.  REVIEW OF SYSTEMS:   Constitutional: Denies fevers, chills or abnormal weight loss Eyes: Denies blurriness of vision Ears, nose, mouth, throat, and face: Denies mucositis or sore throat Respiratory: Denies cough, dyspnea or wheezes Cardiovascular: Denies palpitation, chest discomfort Gastrointestinal:  Denies nausea, heartburn or change in bowel habits Skin: Denies abnormal skin rashes Lymphatics: Denies new lymphadenopathy or easy bruising Neurological:Denies numbness, tingling or new weaknesses Behavioral/Psych: Mood is stable, no new changes  Extremities: No lower extremity edema All other systems were reviewed with the patient and are negative.  I have reviewed the past medical history, past surgical history, social history and family history with the patient and they are unchanged from previous note.  ALLERGIES:  has No Known Allergies.  MEDICATIONS:  Current Outpatient Prescriptions  Medication Sig Dispense Refill  . docusate sodium (COLACE) 50 MG capsule Take 1 capsule (50 mg total) by mouth 2 (two) times daily. As instructed 14 capsule 2  . enoxaparin (LOVENOX) 100 MG/ML injection Inject 1 mL (100 mg total)  into the skin every 12 (twelve) hours. 60 Syringe 2  . oxyCODONE-acetaminophen (ROXICET) 5-325 MG tablet Take 1-2 tablets by mouth every 6 (six) hours as needed. 14 tablet 0  . Prenatal Vit-Fe Fumarate-FA (PRENATAL MULTIVITAMIN) TABS tablet Take 1 tablet by mouth daily at 12 noon.    . Prenatal Vit-Fe Fumarate-FA (PRENATAL MULTIVITAMIN) TABS tablet Take 1 tablet by mouth daily at 12 noon. 30 tablet 6   No current facility-administered medications for this visit.     PHYSICAL EXAMINATION: ECOG PERFORMANCE STATUS: 1 - Symptomatic but completely ambulatory  Vitals:   08/10/16 0905  BP: (!) 139/93  Pulse: 73  Resp: 18  Temp: 98 F (36.7 C)   Filed Weights   08/10/16 0905  Weight: 238 lb 1.6 oz (108 kg)    GENERAL:alert, no distress and comfortable SKIN: skin color, texture, turgor are normal, no rashes or significant lesions EYES: normal, Conjunctiva are pink and non-injected, sclera clear OROPHARYNX:no exudate, no erythema and lips, buccal mucosa, and tongue normal  NECK: supple, thyroid normal size, non-tender, without nodularity LYMPH:  no palpable lymphadenopathy in the cervical, axillary or inguinal LUNGS: clear to auscultation and percussion with normal breathing effort HEART: regular rate & rhythm and no murmurs and no lower extremity edema ABDOMEN:abdomen soft, non-tender and normal bowel sounds MUSCULOSKELETAL:no cyanosis of digits and no clubbing  NEURO: alert & oriented x 3 with fluent speech, no focal motor/sensory deficits EXTREMITIES: No lower extremity edema  LABORATORY DATA:  I have reviewed the data as listed   Chemistry      Component Value Date/Time   NA 135 08/04/2016 0447   K 2.9 (L) 08/04/2016 0447   CL 102 08/04/2016 0447  CO2 25 08/04/2016 0447   BUN <5 (L) 08/04/2016 0447   CREATININE 0.62 08/05/2016 0607      Component Value Date/Time   CALCIUM 8.9 08/04/2016 0447   ALKPHOS 224 (H) 08/04/2016 0447   AST 22 08/04/2016 0447   ALT 23 08/04/2016  0447   BILITOT 1.0 08/04/2016 0447       Lab Results  Component Value Date   WBC 10.6 (H) 08/05/2016   HGB 9.4 (L) 08/05/2016   HCT 27.9 (L) 08/05/2016   MCV 85.6 08/05/2016   PLT 234 08/05/2016   NEUTROABS 3.7 08/13/2015    ASSESSMENT & PLAN:  Pulmonary embolism (HCC) Acute bilateral pulmonary emboli diagnosed 08/13/2015: All lobes of both lungs prominent on the right with the right heart strain, Right lower extremity DVT   Treatment summary: Previously was on Xarelto 20 mg daily. She was changed to Lovenox 100 mg subcutaneous twice a day when she became pregnant , switched her to heparin prior to delivery, currently on Lovenox 100 mg subcutaneous twice a day  Prior workup: 1. Workup for inherited risk factors like prothrombin gene mutation, factor V Leiden, and protein C, protein S, antithrombin III, homocysteine: Negative 2. February 2017: Lupus anticoagulant positive  Plan: 1. Recheck lupus anticoagulant testing in 3 months 2. if it is persistently positive, I recommend lifelong anticoagulation with Xarelto ( to be resumed once she completes breast-feeding)  Return to clinic in 3 months for follow-up    I spent 25 minutes talking to the patient of which more than half was spent in counseling and coordination of care.  Orders Placed This Encounter  Procedures  . Lupus anticoagulant panel    Standing Status:   Future    Standing Expiration Date:   09/14/2017  . CBC with Differential    Standing Status:   Future    Standing Expiration Date:   08/10/2017   The patient has a good understanding of the overall plan. she agrees with it. she will call with any problems that may develop before the next visit here.   Sabas SousGudena, Josceline Chenard K, MD 08/10/16

## 2016-08-10 NOTE — Telephone Encounter (Signed)
Appointments scheduled per 2/8 LOS. Patient given AVS report and calendars with future scheduled appointments.  °

## 2016-08-10 NOTE — Assessment & Plan Note (Addendum)
Acute bilateral pulmonary emboli diagnosed 08/13/2015: All lobes of both lungs prominent on the right with the right heart strain, Right lower extremity DVT   Treatment summary: Previously was on Xarelto 20 mg daily. She was changed to Lovenox 100 mg subcutaneous twice a day when she became pregnant , switched her to heparin prior to delivery, currently on Lovenox 40 mg subcutaneous daily  Prior workup: 1. Workup for inherited risk factors like prothrombin gene mutation, factor V Leiden, and protein C, protein S, antithrombin III, homocysteine: Negative 2. February 2017: Lupus anticoagulant positive  Plan: 1. Recheck lupus anticoagulant testing 2. if it is persistently positive, I recommend lifelong anticoagulation with Xarelto ( to be resumed once she completes breast-feeding)  Return to clinic in 1 year for follow-up

## 2016-11-07 ENCOUNTER — Other Ambulatory Visit (HOSPITAL_BASED_OUTPATIENT_CLINIC_OR_DEPARTMENT_OTHER): Payer: Managed Care, Other (non HMO)

## 2016-11-07 DIAGNOSIS — I2782 Chronic pulmonary embolism: Secondary | ICD-10-CM

## 2016-11-07 LAB — CBC WITH DIFFERENTIAL/PLATELET
BASO%: 0.3 % (ref 0.0–2.0)
BASOS ABS: 0 10*3/uL (ref 0.0–0.1)
EOS%: 1.9 % (ref 0.0–7.0)
Eosinophils Absolute: 0.1 10*3/uL (ref 0.0–0.5)
HEMATOCRIT: 37.8 % (ref 34.8–46.6)
HGB: 12.5 g/dL (ref 11.6–15.9)
LYMPH%: 71.8 % — AB (ref 14.0–49.7)
MCH: 28.2 pg (ref 25.1–34.0)
MCHC: 33.1 g/dL (ref 31.5–36.0)
MCV: 85.1 fL (ref 79.5–101.0)
MONO#: 0.2 10*3/uL (ref 0.1–0.9)
MONO%: 6.6 % (ref 0.0–14.0)
NEUT#: 0.6 10*3/uL — ABNORMAL LOW (ref 1.5–6.5)
NEUT%: 19.4 % — AB (ref 38.4–76.8)
PLATELETS: 272 10*3/uL (ref 145–400)
RBC: 4.44 10*6/uL (ref 3.70–5.45)
RDW: 15.6 % — ABNORMAL HIGH (ref 11.2–14.5)
WBC: 3.2 10*3/uL — ABNORMAL LOW (ref 3.9–10.3)
lymph#: 2.3 10*3/uL (ref 0.9–3.3)

## 2016-11-08 LAB — LUPUS ANTICOAGULANT PANEL
DRVVT: 43.8 s (ref 0.0–47.0)
PTT-LA: 50.6 s (ref 0.0–51.9)

## 2016-11-14 ENCOUNTER — Ambulatory Visit (HOSPITAL_BASED_OUTPATIENT_CLINIC_OR_DEPARTMENT_OTHER): Payer: Managed Care, Other (non HMO) | Admitting: Hematology and Oncology

## 2016-11-14 ENCOUNTER — Telehealth: Payer: Self-pay | Admitting: Emergency Medicine

## 2016-11-14 ENCOUNTER — Ambulatory Visit (HOSPITAL_BASED_OUTPATIENT_CLINIC_OR_DEPARTMENT_OTHER): Payer: Managed Care, Other (non HMO)

## 2016-11-14 DIAGNOSIS — I2782 Chronic pulmonary embolism: Secondary | ICD-10-CM | POA: Diagnosis not present

## 2016-11-14 DIAGNOSIS — Z86718 Personal history of other venous thrombosis and embolism: Secondary | ICD-10-CM | POA: Diagnosis not present

## 2016-11-14 DIAGNOSIS — D709 Neutropenia, unspecified: Secondary | ICD-10-CM

## 2016-11-14 LAB — CBC WITH DIFFERENTIAL/PLATELET
BASO%: 0.5 % (ref 0.0–2.0)
BASOS ABS: 0 10*3/uL (ref 0.0–0.1)
EOS ABS: 0.1 10*3/uL (ref 0.0–0.5)
EOS%: 2 % (ref 0.0–7.0)
HEMATOCRIT: 36 % (ref 34.8–46.6)
HEMOGLOBIN: 11.9 g/dL (ref 11.6–15.9)
LYMPH%: 69.3 % — AB (ref 14.0–49.7)
MCH: 28.3 pg (ref 25.1–34.0)
MCHC: 33.1 g/dL (ref 31.5–36.0)
MCV: 85.7 fL (ref 79.5–101.0)
MONO#: 0.2 10*3/uL (ref 0.1–0.9)
MONO%: 4.8 % (ref 0.0–14.0)
NEUT#: 0.9 10*3/uL — ABNORMAL LOW (ref 1.5–6.5)
NEUT%: 23.4 % — AB (ref 38.4–76.8)
Platelets: 311 10*3/uL (ref 145–400)
RBC: 4.2 10*6/uL (ref 3.70–5.45)
RDW: 15.4 % — ABNORMAL HIGH (ref 11.2–14.5)
WBC: 4 10*3/uL (ref 3.9–10.3)
lymph#: 2.8 10*3/uL (ref 0.9–3.3)

## 2016-11-14 NOTE — Assessment & Plan Note (Signed)
New problem: Neutropenia with a neutrophil count of 600. Previously she only had CBCs and we did not have an [place]. The previous [place] was in February 2017 when it was normal.  I discussed the differential diagnosis is between infections, B-12 deficiency, medication induced, autoimmune disorders like lupus, bone marrow disorders Recommended retesting it today along with ANA, W-80, folic acid. If it is confirmed and no clear-cut cause is identified, she will need a bone marrow biopsy.

## 2016-11-14 NOTE — Assessment & Plan Note (Addendum)
Acute bilateral pulmonary emboli diagnosed 08/13/2015: All lobes of both lungs prominent on the right with the right heart strain, Right lower extremity DVT   Treatment summary: Previously was on Xarelto 20 mg daily. She was changed to Lovenox 100 mg subcutaneous twice a day when she became pregnant , switched her to heparin prior to delivery, currently on Lovenox 100 mg subcutaneous twice a day  Prior workup: 1. Workup for inherited risk factors like prothrombin gene mutation, factor V Leiden, and protein C, protein S, antithrombin III, homocysteine: Negative 2. February 2017: Lupus anticoagulant positive; repeat testing 11/07/2016: Negative; therefore patient does not have antiphospholipid antibody syndrome.  Plan: 1. Since the patient is negative for APL antibody syndrome, I recommended that she could discontinue anticoagulation at this time. She has been on this since February 2017.

## 2016-11-14 NOTE — Telephone Encounter (Signed)
Per Dr Pamelia HoitGudena; called patient and left her a message advising her to keep her upcoming appointment on 5/22 as scheduled. Advised her that her ANC in 0.9 today. Instructed her to call with any questions.

## 2016-11-14 NOTE — Progress Notes (Signed)
Patient Care Team: Patient, No Pcp Per as PCP - General (General Practice)  DIAGNOSIS:  Encounter Diagnoses  Name Primary?  . Other chronic pulmonary embolism without acute cor pulmonale ([place])   . Neutropenia, unspecified type ([place])     CHIEF COMPLIANT: Follow up to discuss lab results  INTERVAL HISTORY: Angel Ho is a 31 yr old with a prior lupus anticoagulant and is here to review the recent blood work for lupus anti-coagulant. Her son is not 72 months old and doing well.  REVIEW OF SYSTEMS:   Constitutional: Denies fevers, chills or abnormal weight loss Eyes: Denies blurriness of vision Ears, nose, mouth, throat, and face: Denies mucositis or sore throat Respiratory: Denies cough, dyspnea or wheezes Cardiovascular: Denies palpitation, chest discomfort Gastrointestinal:  Denies nausea, heartburn or change in bowel habits Skin: Denies abnormal skin rashes Lymphatics: Denies new lymphadenopathy or easy bruising Neurological:Denies numbness, tingling or new weaknesses Behavioral/Psych: Mood is stable, no new changes  Extremities: No lower extremity edema All other systems were reviewed with the patient and are negative.  I have reviewed the past medical history, past surgical history, social history and family history with the patient and they are unchanged from previous note.  ALLERGIES:  has No Known Allergies.  MEDICATIONS:  Current Outpatient Prescriptions  Medication Sig Dispense Refill  . docusate sodium (COLACE) 50 MG capsule Take 1 capsule (50 mg total) by mouth 2 (two) times daily. As instructed 14 capsule 2  . enoxaparin (LOVENOX) 100 MG/ML injection Inject 1 mL (100 mg total) into the skin every 12 (twelve) hours. 60 Syringe 2  . oxyCODONE-acetaminophen (ROXICET) 5-325 MG tablet Take 1-2 tablets by mouth every 6 (six) hours as needed. 14 tablet 0  . Prenatal Vit-Fe Fumarate-FA (PRENATAL MULTIVITAMIN) TABS tablet Take 1 tablet by mouth daily at 12 noon.      . Prenatal Vit-Fe Fumarate-FA (PRENATAL MULTIVITAMIN) TABS tablet Take 1 tablet by mouth daily at 12 noon. 30 tablet 6   No current facility-administered medications for this visit.     PHYSICAL EXAMINATION: ECOG PERFORMANCE STATUS: 0  Vitals:   11/14/16 1533  BP: (!) 138/92  Pulse: 67  Resp: 18  Temp: 98.3 F (36.8 C)   Filed Weights   11/14/16 1533  Weight: 216 lb (98 kg)    GENERAL:alert, no distress and comfortable SKIN: skin color, texture, turgor are normal, no rashes or significant lesions EYES: normal, Conjunctiva are pink and non-injected, sclera clear OROPHARYNX:no exudate, no erythema and lips, buccal mucosa, and tongue normal  NECK: supple, thyroid normal size, non-tender, without nodularity LYMPH:  no palpable lymphadenopathy in the cervical, axillary or inguinal LUNGS: clear to auscultation and percussion with normal breathing effort HEART: regular rate & rhythm and no murmurs and no lower extremity edema ABDOMEN:abdomen soft, non-tender and normal bowel sounds MUSCULOSKELETAL:no cyanosis of digits and no clubbing  NEURO: alert & oriented x 3 with fluent speech, no focal motor/sensory deficits EXTREMITIES: No lower extremity edema  LABORATORY DATA:  I have reviewed the data as listed   Chemistry      Component Value Date/Time   NA 135 08/04/2016 0447   K 2.9 (L) 08/04/2016 0447   CL 102 08/04/2016 0447   CO2 25 08/04/2016 0447   BUN <5 (L) 08/04/2016 0447   CREATININE 0.62 08/05/2016 0607      Component Value Date/Time   CALCIUM 8.9 08/04/2016 0447   ALKPHOS 224 (H) 08/04/2016 0447   AST 22 08/04/2016 0447   ALT 23  08/04/2016 0447   BILITOT 1.0 08/04/2016 0447       Lab Results  Component Value Date   WBC 4.0 11/14/2016   HGB 11.9 11/14/2016   HCT 36.0 11/14/2016   MCV 85.7 11/14/2016   PLT 311 11/14/2016   NEUTROABS 0.9 (L) 11/14/2016    ASSESSMENT & PLAN:  Pulmonary embolism (HCC) Acute bilateral pulmonary emboli diagnosed  08/13/2015: All lobes of both lungs prominent on the right with the right heart strain, Right lower extremity DVT   Treatment summary: Previously was on Xarelto 20 mg daily. She was changed to Lovenox 100 mg subcutaneous twice a day when she became pregnant , switched her to heparin prior to delivery, currently on Lovenox 100 mg subcutaneous twice a day  Prior workup: 1. Workup for inherited risk factors like prothrombin gene mutation, factor V Leiden, and protein C, protein S, antithrombin III, homocysteine: Negative 2. February 2017: Lupus anticoagulant positive; repeat testing 11/07/2016: Negative; therefore patient does not have antiphospholipid antibody syndrome.  Plan: 1. Since the patient is negative for APL antibody syndrome, I recommended that she could discontinue anticoagulation at this time. She has been on this since February 2017.    Neutropenia ([place]) New problem: Neutropenia with a neutrophil count of 600. Previously she only had CBCs and we did not have an [place]. The previous [place] was in February 2017 when it was normal.  I discussed the differential diagnosis is between infections, B-12 deficiency, medication induced, autoimmune disorders like lupus, bone marrow disorders Recommended retesting it today along with ANA, F-00, folic acid. If it is confirmed and no clear-cut cause is identified, she will need a bone marrow biopsy.    I spent 25 minutes talking to the patient of which more than half was spent in counseling and coordination of care.  Orders Placed This Encounter  Procedures  . CBC with Differential    Standing Status:   Future    Number of Occurrences:   1    Standing Expiration Date:   11/14/2017  . ANA, IFA (with reflex)    Standing Status:   Future    Number of Occurrences:   1    Standing Expiration Date:   11/14/2017  . Vitamin B12    Standing Status:   Future    Number of Occurrences:   1    Standing Expiration Date:   11/14/2017  . Folate, Serum     Standing Status:   Future    Number of Occurrences:   1    Standing Expiration Date:   11/14/2017   The patient has a good understanding of the overall plan. she agrees with it. she will call with any problems that may develop before the next visit here.   Rulon Eisenmenger, MD 11/14/16

## 2016-11-15 LAB — FOLATE: Folate: 8.8 ng/mL (ref >3.0)

## 2016-11-15 LAB — ANTINUCLEAR ANTIBODIES, IFA: ANTINUCLEAR ANTIBODIES, IFA: NEGATIVE

## 2016-11-15 LAB — VITAMIN B12: Vitamin B12: 434 pg/mL (ref 232–1245)

## 2016-11-21 ENCOUNTER — Ambulatory Visit (HOSPITAL_BASED_OUTPATIENT_CLINIC_OR_DEPARTMENT_OTHER): Payer: Managed Care, Other (non HMO)

## 2016-11-21 ENCOUNTER — Ambulatory Visit (HOSPITAL_BASED_OUTPATIENT_CLINIC_OR_DEPARTMENT_OTHER): Payer: Managed Care, Other (non HMO) | Admitting: Hematology and Oncology

## 2016-11-21 ENCOUNTER — Encounter: Payer: Self-pay | Admitting: Hematology and Oncology

## 2016-11-21 DIAGNOSIS — D709 Neutropenia, unspecified: Secondary | ICD-10-CM

## 2016-11-21 DIAGNOSIS — Z86718 Personal history of other venous thrombosis and embolism: Secondary | ICD-10-CM

## 2016-11-21 DIAGNOSIS — Z86711 Personal history of pulmonary embolism: Secondary | ICD-10-CM

## 2016-11-21 DIAGNOSIS — I2782 Chronic pulmonary embolism: Secondary | ICD-10-CM

## 2016-11-21 LAB — CBC WITH DIFFERENTIAL/PLATELET
BASO%: 0.7 % (ref 0.0–2.0)
Basophils Absolute: 0 10*3/uL (ref 0.0–0.1)
EOS ABS: 0 10*3/uL (ref 0.0–0.5)
EOS%: 0.9 % (ref 0.0–7.0)
HEMATOCRIT: 37.5 % (ref 34.8–46.6)
HGB: 12.2 g/dL (ref 11.6–15.9)
LYMPH%: 53.3 % — ABNORMAL HIGH (ref 14.0–49.7)
MCH: 28 pg (ref 25.1–34.0)
MCHC: 32.5 g/dL (ref 31.5–36.0)
MCV: 86 fL (ref 79.5–101.0)
MONO#: 0.3 10*3/uL (ref 0.1–0.9)
MONO%: 7.3 % (ref 0.0–14.0)
NEUT#: 1.6 10*3/uL (ref 1.5–6.5)
NEUT%: 37.8 % — ABNORMAL LOW (ref 38.4–76.8)
PLATELETS: 299 10*3/uL (ref 145–400)
RBC: 4.36 10*6/uL (ref 3.70–5.45)
RDW: 15.5 % — AB (ref 11.2–14.5)
WBC: 4.2 10*3/uL (ref 3.9–10.3)
lymph#: 2.3 10*3/uL (ref 0.9–3.3)

## 2016-11-21 NOTE — Assessment & Plan Note (Signed)
Acute bilateral pulmonary emboli diagnosed 08/13/2015: All lobes of both lungs prominent on the right with the right heart strain, Right lower extremity DVT   Treatment summary: Previously was on Xarelto 20 mg daily. She was changed to Lovenox 100 mg subcutaneous twice a day when she became pregnant , switched her to heparin prior to delivery, stopped anticoagulation 11/14/2016  Prior workup: 1. Workup for inherited risk factors like prothrombin gene mutation, factor V Leiden, and protein C, protein S, antithrombin III, homocysteine: Negative 2. February 2017: Lupus anticoagulant positive; repeat testing 11/07/2016: Negative; therefore patient does not have antiphospholipid antibody syndrome.  Plan: Since the patient is negative for APL antibody syndrome, I recommended that we stop anticoagulation 11/14/2016

## 2016-11-21 NOTE — Assessment & Plan Note (Signed)
Absolute neutrophil count: 887 L-73: 081 Folic acid 8.8 ANA negative  All the above tests are normal, I recommended that he obtain a bone marrow biopsy to make sure she does not have any myelodysplastic syndrome.

## 2016-11-21 NOTE — Progress Notes (Signed)
Patient Care Team: Patient, No Pcp Per as PCP - General (General Practice)  DIAGNOSIS:  Encounter Diagnoses  Name Primary?  . 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 Neutropenia, unspecified type ([place])    CHIEF COMPLIANT: follow-up of neutropenia  INTERVAL HISTORY: Angel Ho is a 31 year old with above-mentioned history of pulmonary embolism who was taken off anticoagulation last week and was noted to have neutropenia. Because of this she had more testing done including ANA, M-27 and folic acid and is here to review the results. It appears the neutrophil count had improved to 900. She has absolutely no symptoms related to this. She does not feel ill has not been started on any new medications.  REVIEW OF SYSTEMS:   Constitutional: Denies fevers, chills or abnormal weight loss Eyes: Denies blurriness of vision Ears, nose, mouth, throat, and face: Denies mucositis or sore throat Respiratory: Denies cough, dyspnea or wheezes Cardiovascular: Denies palpitation, chest discomfort Gastrointestinal:  Denies nausea, heartburn or change in bowel habits Skin: Denies abnormal skin rashes Lymphatics: Denies new lymphadenopathy or easy bruising Neurological:Denies numbness, tingling or new weaknesses Behavioral/Psych: Mood is stable, no new changes  Extremities: No lower extremity edema All other systems were reviewed with the patient and are negative.  I have reviewed the past medical history, past surgical history, social history and family history with the patient and they are unchanged from previous note.  ALLERGIES:  has No Known Allergies.  MEDICATIONS:  Current Outpatient Prescriptions  Medication Sig Dispense Refill  . docusate sodium (COLACE) 50 MG capsule Take 1 capsule (50 mg total) by mouth 2 (two) times daily. As instructed 14 capsule 2  . enoxaparin (LOVENOX) 100 MG/ML injection Inject 1 mL (100 mg total) into the skin every 12 (twelve)  hours. 60 Syringe 2  . oxyCODONE-acetaminophen (ROXICET) 5-325 MG tablet Take 1-2 tablets by mouth every 6 (six) hours as needed. 14 tablet 0  . Prenatal Vit-Fe Fumarate-FA (PRENATAL MULTIVITAMIN) TABS tablet Take 1 tablet by mouth daily at 12 noon.    . Prenatal Vit-Fe Fumarate-FA (PRENATAL MULTIVITAMIN) TABS tablet Take 1 tablet by mouth daily at 12 noon. 30 tablet 6   No current facility-administered medications for this visit.     PHYSICAL EXAMINATION: ECOG PERFORMANCE STATUS: 1 - Symptomatic but completely ambulatory  Vitals:   11/21/16 1544  BP: (!) 142/92  Pulse: 71  Resp: 18  Temp: 98.4 F (36.9 C)   Filed Weights   11/21/16 1544  Weight: 215 lb 3.2 oz (97.6 kg)    GENERAL:alert, no distress and comfortable SKIN: skin color, texture, turgor are normal, no rashes or significant lesions EYES: normal, Conjunctiva are pink and non-injected, sclera clear OROPHARYNX:no exudate, no erythema and lips, buccal mucosa, and tongue normal  NECK: supple, thyroid normal size, non-tender, without nodularity LYMPH:  no palpable lymphadenopathy in the cervical, axillary or inguinal LUNGS: clear to auscultation and percussion with normal breathing effort HEART: regular rate & rhythm and no murmurs and no lower extremity edema ABDOMEN:abdomen soft, non-tender and normal bowel sounds MUSCULOSKELETAL:no cyanosis of digits and no clubbing  NEURO: alert & oriented x 3 with fluent speech, no focal motor/sensory deficits EXTREMITIES: No lower extremity edema  LABORATORY DATA:  I have reviewed the data as listed   Chemistry      Component Value Date/Time   NA 135 08/04/2016 0447   K 2.9 (L) 08/04/2016 0447   CL 102 08/04/2016 0447   CO2 25 08/04/2016 0447  BUN <5 (L) 08/04/2016 0447   CREATININE 0.62 08/05/2016 0607      Component Value Date/Time   CALCIUM 8.9 08/04/2016 0447   ALKPHOS 224 (H) 08/04/2016 0447   AST 22 08/04/2016 0447   ALT 23 08/04/2016 0447   BILITOT 1.0  08/04/2016 0447       Lab Results  Component Value Date   WBC 4.0 11/14/2016   HGB 11.9 11/14/2016   HCT 36.0 11/14/2016   MCV 85.7 11/14/2016   PLT 311 11/14/2016   NEUTROABS 0.9 (L) 11/14/2016    ASSESSMENT & PLAN:  Pulmonary embolism (HCC) Acute bilateral pulmonary emboli diagnosed 08/13/2015: All lobes of both lungs prominent on the right with the right heart strain, Right lower extremity DVT   Treatment summary: Previously was on Xarelto 20 mg daily. She was changed to Lovenox 100 mg subcutaneous twice a day when she became pregnant , switched her to heparin prior to delivery, stopped anticoagulation 11/14/2016  Prior workup: 1. Workup for inherited risk factors like prothrombin gene mutation, factor V Leiden, and protein C, protein S, antithrombin III, homocysteine: Negative 2. February 2017: Lupus anticoagulant positive; repeat testing 11/07/2016: Negative; therefore patient does not have antiphospholipid antibody syndrome.  Plan: Since the patient is negative for APL antibody syndrome, I recommended that we stop anticoagulation 11/14/2016  Neutropenia (HCC) Absolute neutrophil count: 867 E-72: 094 Folic acid 8.8 ANA negative  Recheck of her CBC today revealed an ANC of 1.6. This reveals that her blood counts have normalized. It is unclear why she developed neutropenia for a short period of time. Because of this I like to see her back in 3 months with recheck of her CBC. If she has persistent or recurrent neutropenia then we may have to do a bone marrow biopsy.  I spent 25 minutes talking to the patient of which more than half was spent in counseling and coordination of care.  No orders of the defined types were placed in this encounter.  The patient has a good understanding of the overall plan. she agrees with it. she will call with any problems that may develop before the next visit here.   Rulon Eisenmenger, MD 11/21/16

## 2017-02-22 ENCOUNTER — Other Ambulatory Visit: Payer: Self-pay

## 2017-02-22 DIAGNOSIS — D709 Neutropenia, unspecified: Secondary | ICD-10-CM

## 2017-02-22 DIAGNOSIS — O99119 Other diseases of the blood and blood-forming organs and certain disorders involving the immune mechanism complicating pregnancy, unspecified trimester: Secondary | ICD-10-CM

## 2017-02-22 DIAGNOSIS — D6851 Activated protein C resistance: Secondary | ICD-10-CM

## 2017-02-22 DIAGNOSIS — I2782 Chronic pulmonary embolism: Secondary | ICD-10-CM

## 2017-02-23 ENCOUNTER — Other Ambulatory Visit (HOSPITAL_BASED_OUTPATIENT_CLINIC_OR_DEPARTMENT_OTHER): Payer: Managed Care, Other (non HMO)

## 2017-02-23 DIAGNOSIS — O99119 Other diseases of the blood and blood-forming organs and certain disorders involving the immune mechanism complicating pregnancy, unspecified trimester: Secondary | ICD-10-CM

## 2017-02-23 DIAGNOSIS — D6851 Activated protein C resistance: Secondary | ICD-10-CM

## 2017-02-23 DIAGNOSIS — D709 Neutropenia, unspecified: Secondary | ICD-10-CM

## 2017-02-23 DIAGNOSIS — I2782 Chronic pulmonary embolism: Secondary | ICD-10-CM

## 2017-02-23 LAB — CBC WITH DIFFERENTIAL/PLATELET
BASO%: 1 % (ref 0.0–2.0)
Basophils Absolute: 0 10*3/uL (ref 0.0–0.1)
EOS%: 1.2 % (ref 0.0–7.0)
Eosinophils Absolute: 0.1 10*3/uL (ref 0.0–0.5)
HCT: 36.5 % (ref 34.8–46.6)
HGB: 11.8 g/dL (ref 11.6–15.9)
LYMPH%: 59.3 % — AB (ref 14.0–49.7)
MCH: 28.5 pg (ref 25.1–34.0)
MCHC: 32.3 g/dL (ref 31.5–36.0)
MCV: 88.2 fL (ref 79.5–101.0)
MONO#: 0.3 10*3/uL (ref 0.1–0.9)
MONO%: 6.9 % (ref 0.0–14.0)
NEUT#: 1.3 10*3/uL — ABNORMAL LOW (ref 1.5–6.5)
NEUT%: 31.6 % — AB (ref 38.4–76.8)
PLATELETS: 266 10*3/uL (ref 145–400)
RBC: 4.14 10*6/uL (ref 3.70–5.45)
RDW: 14.4 % (ref 11.2–14.5)
WBC: 4.2 10*3/uL (ref 3.9–10.3)
lymph#: 2.5 10*3/uL (ref 0.9–3.3)

## 2017-05-25 ENCOUNTER — Ambulatory Visit: Payer: Managed Care, Other (non HMO) | Admitting: Hematology and Oncology

## 2017-06-01 ENCOUNTER — Ambulatory Visit: Payer: Managed Care, Other (non HMO) | Admitting: Hematology and Oncology

## 2018-10-13 IMAGING — US US MFM OB DETAIL+14 WK
1 series · 12 of 28 positions shown · non-contrast
Comparison: none

[Series 1: us mfm ob detail+14 wk · 12 of 71 slices shown]
[im 3/71]
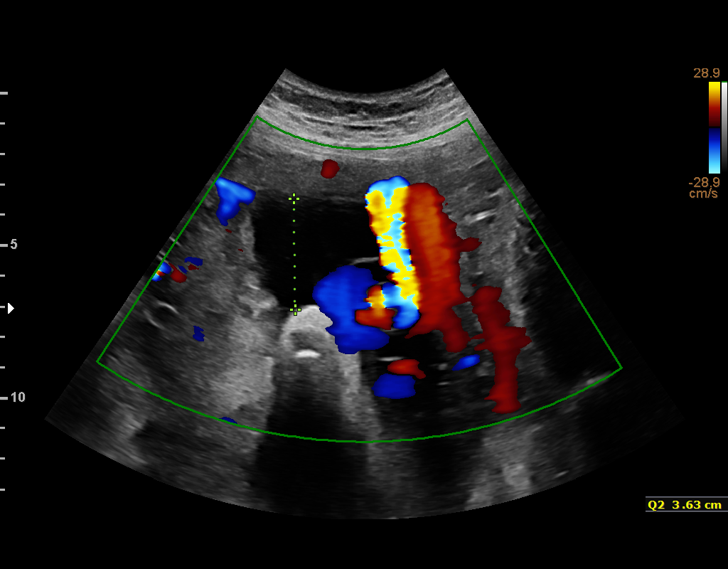
[im 8/71]
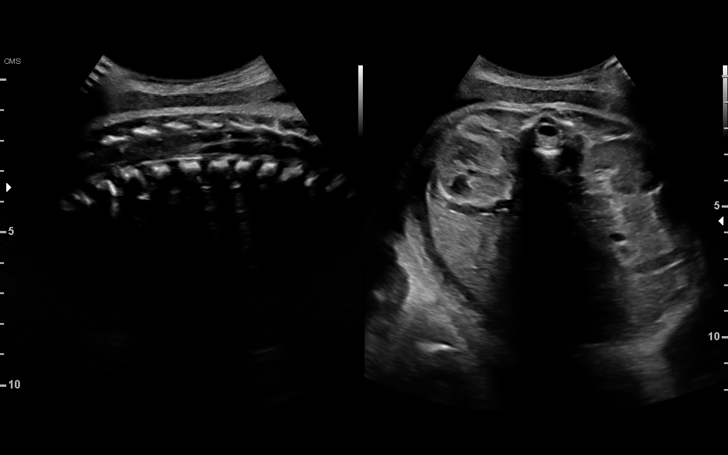
[im 13/71]
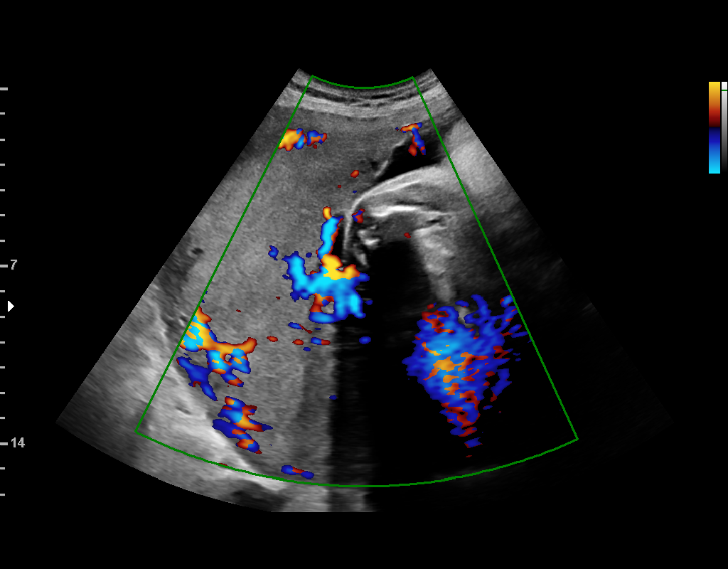
[im 21/71]
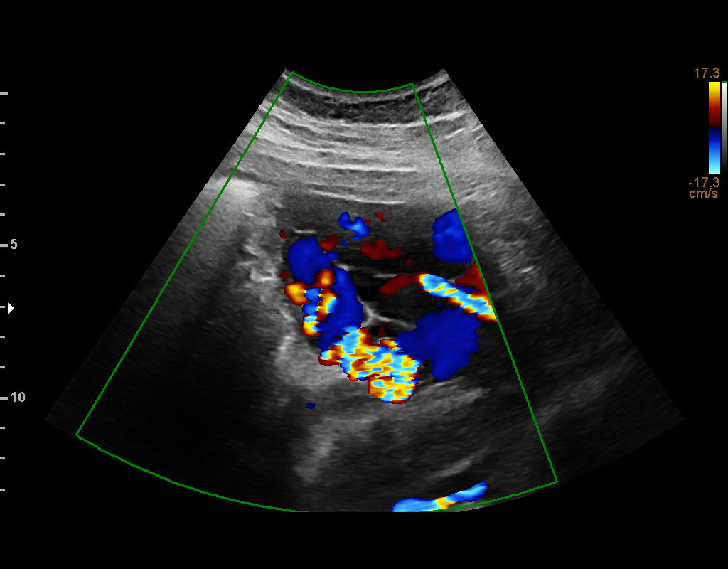
[im 26/71]
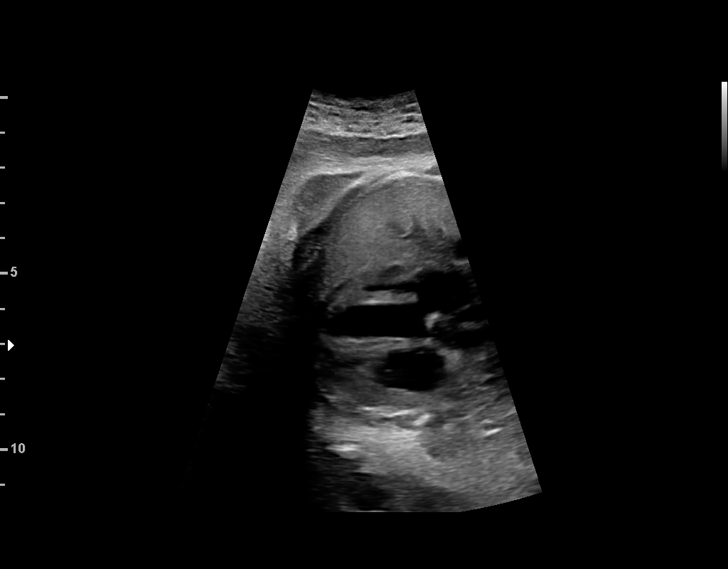
[im 32/71]
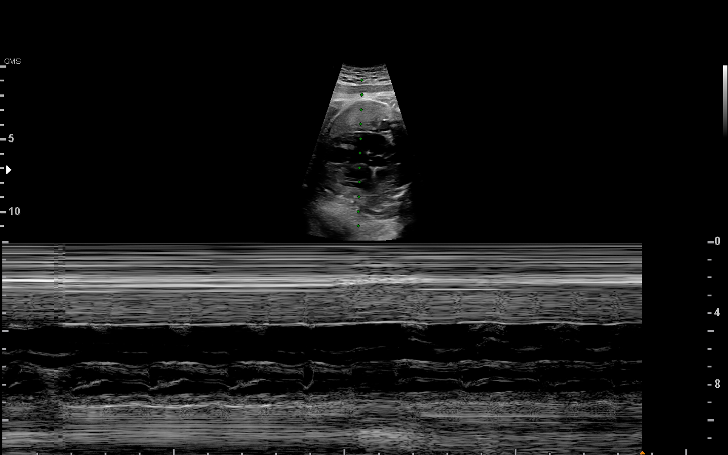
[im 39/71]
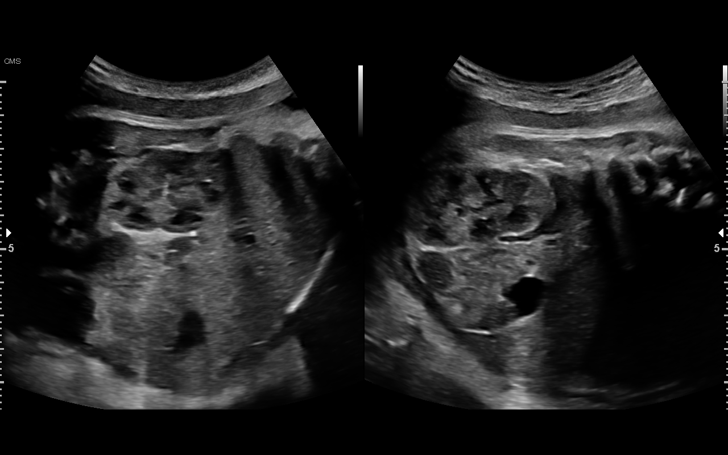
[im 45/71]
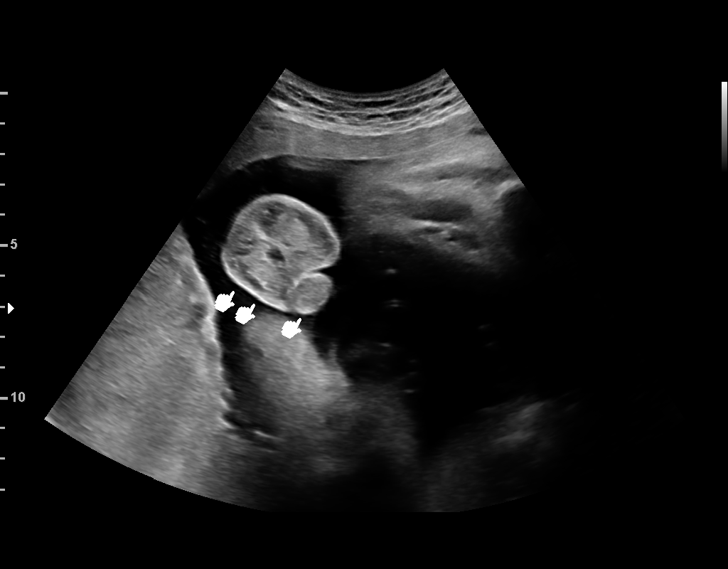
[im 50/71]
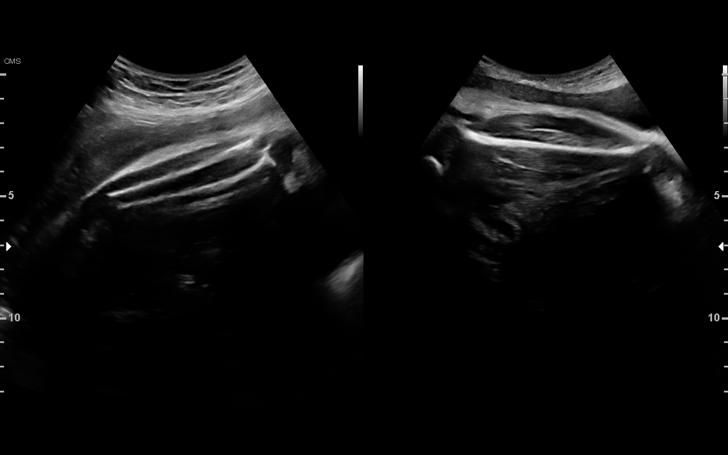
[im 58/71]
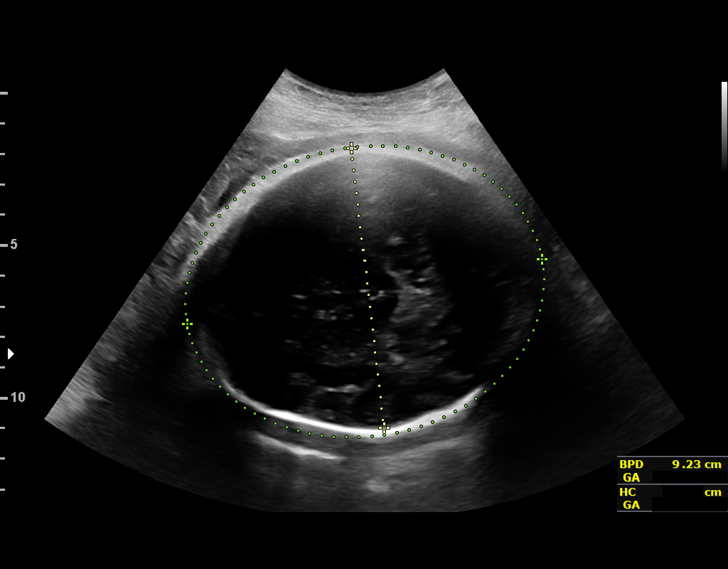
[im 63/71]
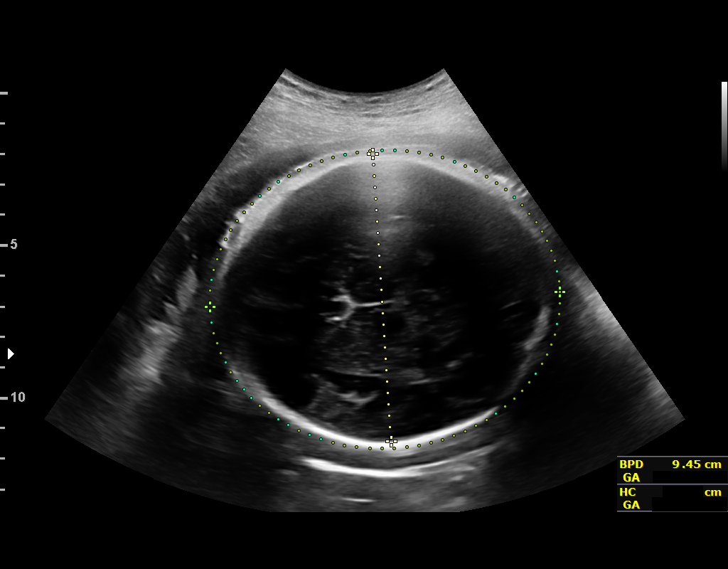
[im 68/71]
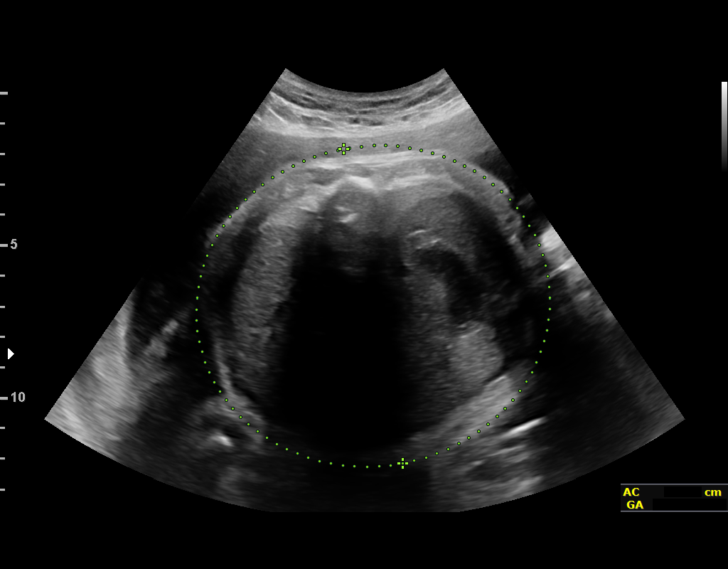

[12 of 28 positions shown; findings below may reference images not displayed]

Obstetrics &
Gynecology
[address]

1  NAZARETH JUMPER           [id number]      [id number]     [id number]
Indications

35 weeks gestation of pregnancy
Encounter for antenatal screening for
malformations
Abnormality fetal heart rate or rhythm, 3rd
trimester
Factor V Leiden mutation affecting
pregnancy
Pulmonary embolism affecting pregnancy
(on Lovenox)
OB History

Gravidity:    5         Term:   1        Prem:   0        SAB:   3
TOP:          0       Ectopic:  0        Living: 1
Fetal Evaluation

Num Of Fetuses:     1
Fetal Heart         134
Rate(bpm):
Cardiac Activity:   Arrhythmia noted
Presentation:       Cephalic
Placenta:           Fundal, above cervical os
P. Cord Insertion:  Not well visualized

Amniotic Fluid
AFI FV:      Subjectively within normal limits
AFI Sum(cm)     %Tile       Largest Pocket(cm)
17.15           63

RUQ(cm)       RLQ(cm)       LUQ(cm)        LLQ(cm)
5.57          4.95          3.63           3
Biometry

BPD:        94  mm     G. Age:  38w 2d       > 99  %    CI:        76.54   %   70 - 86
FL/HC:      21.1   %   20.1 -
HC:      340.4  mm     G. Age:  39w 1d         97  %    HC/AC:      0.97       0.93 -
AC:      349.7  mm     G. Age:  38w 6d       > 97  %    FL/BPD:     76.3   %   71 - 87
FL:       71.7  mm     G. Age:  36w 5d         83  %    FL/AC:      20.5   %   20 - 24
HUM:      64.2  mm     G. Age:  37w 2d       > 95  %
Est. FW:    4247  gm    7 lb 10 oz    > 90  %
Gestational Age

LMP:           35w 0d       Date:   11/04/15                 EDD:   08/10/16
U/S Today:     38w 2d                                        EDD:   07/18/16
Best:          35w 0d    Det. By:   LMP  (11/04/15)          EDD:   08/10/16
Anatomy

Cranium:               Appears normal         Aortic Arch:            Appears normal
Cavum:                 Not well visualized    Ductal Arch:            Not well visualized
Ventricles:            Appears normal         Diaphragm:              Appears normal
Choroid Plexus:        Appears normal         Stomach:                Appears normal, left
sided
Cerebellum:            Appears normal         Abdomen:                Appears normal
Posterior Fossa:       Appears normal         Abdominal Wall:         Not well visualized
Nuchal Fold:           Not applicable (>20    Cord Vessels:           Not well visualized
wks GA)
Face:                  Not well visualized    Kidneys:                Appear normal
Lips:                  Not well visualized    Bladder:                Appears normal
Thoracic:              Appears normal         Spine:                  Appears normal
Heart:                 Appears normal         Upper Extremities:      Visualized
(4CH, axis, and
situs)
RVOT:                  Appears normal         Lower Extremities:      Visualized
LVOT:                  Appears normal

Other:  Fetus appears to be a male. Technically difficult due to advanced
gestational age.
Cervix Uterus Adnexa

Cervix
Not visualized (advanced GA >08wks)

Uterus
No abnormality visualized.

Left Ovary
Within normal limits.

Right Ovary
Not visualized.

Cul De Sac:   No free fluid seen.

Adnexa:       No abnormality visualized.
Impression

Single living intrauterine pregnancy at 12w0d.
Appropriate interval fetal growth (>90%). AC>97%ile.
Normal amniotic fluid volume.
Fetal arrhythmia again noted. The baseline fetal heart rate is
within normal limits in the 130s. The heart appears
structurally normal. The ductal arch is not well visualized.
There is no evidence of a pericardial effusion or hydrops.
Complete anatomic evaluation limited by gestational age and
fetal position.
No gross fetal anomalies identified.
Recommendations

I discussed the findings of today's ultrasound with the patient.
Given an overall normal fetal heart rate, active fetus, and no
sonographic evidence of pathologic fluid collections this most
likely represents premature atrial or ventricular beats which
are a common finding and often self limited.
Fetal echocardiogram scheduled today for further evaluation.
Continue weekly BPPs through your office as scheduled.

## 2021-07-05 ENCOUNTER — Ambulatory Visit
Admission: EM | Admit: 2021-07-05 | Discharge: 2021-07-05 | Disposition: A | Discharge status: Home / Self Care | Payer: Medicaid Other | Attending: Family Medicine | Admitting: Family Medicine

## 2021-07-05 ENCOUNTER — Encounter: Payer: Self-pay | Admitting: Emergency Medicine

## 2021-07-05 ENCOUNTER — Other Ambulatory Visit: Payer: Self-pay

## 2021-07-05 DIAGNOSIS — L209 Atopic dermatitis, unspecified: Secondary | ICD-10-CM | POA: Diagnosis not present

## 2021-07-05 MED ORDER — DEXAMETHASONE SODIUM PHOSPHATE 10 MG/ML IJ SOLN
10.0000 mg | Freq: Once | INTRAMUSCULAR | Status: AC
  Administered 2021-07-05: 10 mg via INTRAMUSCULAR

## 2021-07-05 MED ORDER — PREDNISONE 20 MG PO TABS: 20.0000 mg | ORAL_TABLET | Freq: Every day | ORAL | 0 refills | Status: AC

## 2021-07-05 MED ORDER — TRIAMCINOLONE ACETONIDE 0.025 % EX CREA: 1.0000 "application " | TOPICAL_CREAM | Freq: Two times a day (BID) | CUTANEOUS | 0 refills | Status: DC

## 2021-07-05 NOTE — ED Provider Notes (Signed)
[facility]    CSN: [id number] Arrival date & time: 07/05/21  1710      History   Chief Complaint Chief Complaint  Patient presents with   Rash    HPI Angel Ho is a 36 y.o. female.   HPI Patient presents today for evaluation of a reoccurring rash body rash which is pruritic, urticarial appearance, and diffuse distributed. Current rash present over the last 2 days. Similar rash appeared over two weeks ago and resolved with benadryl. Recently changed hair care products and laundry detergent she is uncertain if these changes are the source of the rash. She has not taken any medication today but has taken benadryl last night with minimal relief.     Past Medical History:  Diagnosis Date   Factor V Leiden mutation affecting pregnancy (HCC)    No pertinent past medical history     Patient Active Problem List   Diagnosis Date Noted   Neutropenia (HCC) 11/14/2016   S/P cesarean section 08/05/2016   Status post bilateral salpingectomy 08/05/2016   Iron deficiency anemia 08/04/2016   Positive GBS test 08/04/2016   Fetal arrhythmia affecting pregnancy, antepartum 08/04/2016   Factor V Leiden mutation affecting pregnancy (HCC) 08/04/2016   Morbid obesity with BMI of 40.0-44.9, adult (HCC) 08/04/2016   Recurrent pregnancy loss, currently pregnant 08/03/2016   Pulmonary embolism (HCC) 08/13/2015   Right leg swelling 08/13/2015   Right calf pain 08/13/2015    Past Surgical History:  Procedure Laterality Date   CESAREAN SECTION N/A 08/04/2016   Procedure: CESAREAN SECTION;  Surgeon: Osborn Coho, MD;  Location: [facility] BIRTHING SU[facility];  Service: Obstetrics;  Laterality: N/A;   NO PAST SURGERIES      OB History     Gravida  5   Para  1   Term  1   Preterm      AB  3   Living  1      SAB  3   IAB      Ectopic      Multiple      Live Births  1            Home Medications    Prior to Admission medications   Medication Sig Start Date End  Date Taking? Authorizing Provider  predniSONE (DELTASONE) 20 MG tablet Take 1 tablet (20 mg total) by mouth daily with breakfast for 5 days. 07/05/21 07/10/21 Yes Bing Neighbors, FNP  triamcinolone (KENALOG) 0.025 % cream Apply 1 application topically 2 (two) times daily. 07/05/21  Yes Bing Neighbors, FNP  docusate sodium (COLACE) 50 MG capsule Take 1 capsule (50 mg total) by mouth 2 (two) times daily. As instructed 08/07/16   Gerrit Heck, CNM  enoxaparin (LOVENOX) 100 MG/ML injection Inject 1 mL (100 mg total) into the skin every 12 (twelve) hours. 08/07/16   Gerrit Heck, CNM  oxyCODONE-acetaminophen (ROXICET) 5-325 MG tablet Take 1-2 tablets by mouth every 6 (six) hours as needed. 08/07/16   Gerrit Heck, CNM  Prenatal Vit-Fe Fumarate-FA (PRENATAL MULTIVITAMIN) TABS tablet Take 1 tablet by mouth daily at 12 noon.    [provider]  Prenatal Vit-Fe Fumarate-FA (PRENATAL MULTIVITAMIN) TABS tablet Take 1 tablet by mouth daily at 12 noon. 08/07/16   Gerrit Heck, CNM    Family History Family History  Problem Relation Age of Onset   Diabetes Father    Diabetes Other     Social History Social History   Tobacco Use   Smoking status:  Former    Types: Cigarettes    Quit date: 04/08/2011    Years since quitting: 10.2   Smokeless tobacco: Never  Vaping Use   Vaping Use: Never used  Substance Use Topics   Alcohol use: Yes    Comment: occasional   Drug use: No     Allergies   Patient has no known allergies.  Review of Systems Review of Systems Pertinent negatives listed in HPI   Physical Exam Triage Vital Signs ED Triage Vitals  Enc Vitals Group     BP 07/05/21 1758 123/89     Pulse Rate 07/05/21 1758 97     Resp 07/05/21 1758 16     Temp 07/05/21 1758 98.5 F (36.9 C)     Temp Source 07/05/21 1758 Oral     SpO2 07/05/21 1758 96 %     Weight --      Height --      Head Circumference --      Peak Flow --      Pain Score 07/05/21 1756 0     Pain Loc --      Pain  Edu? --      Excl. in GC? --    No data found.  Updated Vital Signs BP 123/89 (BP Location: Left Arm)    Pulse 97    Temp 98.5 F (36.9 C) (Oral)    Resp 16    LMP 06/26/2021    SpO2 96%   Visual Acuity Right Eye Distance:   Left Eye Distance:   Bilateral Distance:    Right Eye Near:   Left Eye Near:    Bilateral Near:     Physical Exam General appearance: Alert, well developed, well nourished, cooperative  Head: Normocephalic, without obvious abnormality, atraumatic Respiratory: Respirations even and unlabored, normal respiratory rate Heart: Rate and rhythm normal.  Skin: Fine macular generalized rash-x 4 -extremities, torso, neck, back, facial cheeks and chin Psych: Appropriate mood and affect.   UC Treatments / Results  Labs (all labs ordered are listed, but only abnormal results are displayed) Labs Reviewed - No data to display  EKG   Radiology No results found.  Procedures Procedures (including critical care time)  Medications Ordered in UC Medications  dexamethasone (DECADRON) injection 10 mg (10 mg Intramuscular Given 07/05/21 1834)    Initial Impression / Assessment and Plan / UC Course  I have reviewed the triage vital signs and the nursing notes.  Pertinent labs & imaging results that were available during my care of the patient were reviewed by me and considered in my medical decision making (see chart for details).    Atopic Dermatitis  Decadron 10 mg IM given in clinic Start oral prednisone 20 mg tomorrow x 5 days  Topical steroid BID PRN Recommended discontinuing new hair care products and detergents. Follow-up with dermatology if rash continues to reoccur.  Final Clinical Impressions(s) / UC Diagnoses   Final diagnoses:  Atopic dermatitis, unspecified type     Discharge Instructions      If the skin rash continues to reoccur, recommend follow-up with dermatologist for further evaluation of the cause of rash.     ED Prescriptions      Medication Sig Dispense Auth. Provider   predniSONE (DELTASONE) 20 MG tablet Take 1 tablet (20 mg total) by mouth daily with breakfast for 5 days. 5 tablet Bing Neighbors, FNP   triamcinolone (KENALOG) 0.025 % cream Apply 1 application topically 2 (two) times daily. 454  g Bing NeighborsHarris, Carlyn Mullenbach S, FNP      PDMP not reviewed this encounter.   Bing NeighborsHarris, Johnika Escareno S, [place]FNP 07/06/21 [phone]

## 2021-07-05 NOTE — ED Triage Notes (Signed)
Pt started itching all over and had whelps on 12/24 she took benadryl and it went away. The rash returned 2 days ago.

## 2021-07-05 NOTE — Discharge Instructions (Addendum)
If the skin rash continues to reoccur, recommend follow-up with dermatologist for further evaluation of the cause of rash.

## 2021-10-27 DIAGNOSIS — N760 Acute vaginitis: Secondary | ICD-10-CM | POA: Diagnosis not present

## 2021-10-27 DIAGNOSIS — Z113 Encounter for screening for infections with a predominantly sexual mode of transmission: Secondary | ICD-10-CM | POA: Diagnosis not present

## 2022-08-31 DIAGNOSIS — N760 Acute vaginitis: Secondary | ICD-10-CM | POA: Diagnosis not present

## 2022-08-31 DIAGNOSIS — B9689 Other specified bacterial agents as the cause of diseases classified elsewhere: Secondary | ICD-10-CM | POA: Diagnosis not present

## 2022-08-31 DIAGNOSIS — Z202 Contact with and (suspected) exposure to infections with a predominantly sexual mode of transmission: Secondary | ICD-10-CM | POA: Diagnosis not present

## 2023-03-29 ENCOUNTER — Encounter: Payer: Self-pay | Admitting: *Deleted

## 2023-03-29 ENCOUNTER — Emergency Department: Payer: BC Managed Care – PPO

## 2023-03-29 ENCOUNTER — Emergency Department
Admission: EM | Admit: 2023-03-29 | Discharge: 2023-03-29 | Disposition: A | Discharge status: Home / Self Care | Payer: BC Managed Care – PPO | Attending: Emergency Medicine | Admitting: Emergency Medicine

## 2023-03-29 ENCOUNTER — Other Ambulatory Visit: Payer: Self-pay

## 2023-03-29 DIAGNOSIS — M79604 Pain in right leg: Secondary | ICD-10-CM

## 2023-03-29 DIAGNOSIS — M79661 Pain in right lower leg: Secondary | ICD-10-CM | POA: Diagnosis not present

## 2023-03-29 DIAGNOSIS — R55 Syncope and collapse: Secondary | ICD-10-CM | POA: Diagnosis not present

## 2023-03-29 DIAGNOSIS — H538 Other visual disturbances: Secondary | ICD-10-CM | POA: Insufficient documentation

## 2023-03-29 DIAGNOSIS — R42 Dizziness and giddiness: Secondary | ICD-10-CM | POA: Diagnosis not present

## 2023-03-29 DIAGNOSIS — R202 Paresthesia of skin: Secondary | ICD-10-CM | POA: Diagnosis not present

## 2023-03-29 LAB — COMPREHENSIVE METABOLIC PANEL
ALT: 16 U/L (ref 0–44)
AST: 20 U/L (ref 15–41)
Albumin: 3.3 g/dL — ABNORMAL LOW (ref 3.5–5.0)
Alkaline Phosphatase: 48 U/L (ref 38–126)
Anion gap: 8 (ref 5–15)
BUN: 10 mg/dL (ref 6–20)
CO2: 24 mmol/L (ref 22–32)
Calcium: 8.7 mg/dL — ABNORMAL LOW (ref 8.9–10.3)
Chloride: 107 mmol/L (ref 98–111)
Creatinine, Ser: 0.91 mg/dL (ref 0.44–1.00)
GFR, Estimated: >60 mL/min (ref >60)
Glucose, Bld: 94 mg/dL (ref 70–99)
Potassium: 3.9 mmol/L (ref 3.5–5.1)
Sodium: 139 mmol/L (ref 135–145)
Total Bilirubin: 0.5 mg/dL (ref 0.3–1.2)
Total Protein: 6.3 g/dL — ABNORMAL LOW (ref 6.5–8.1)

## 2023-03-29 LAB — DIFFERENTIAL
Abs Immature Granulocytes: 0.01 10*3/uL (ref 0.00–0.07)
Basophils Absolute: 0.1 10*3/uL (ref 0.0–0.1)
Basophils Relative: 1 %
Eosinophils Absolute: 0.1 10*3/uL (ref 0.0–0.5)
Eosinophils Relative: 2 %
Immature Granulocytes: 0 %
Lymphocytes Relative: 50 %
Lymphs Abs: 2.8 10*3/uL (ref 0.7–4.0)
Monocytes Absolute: 0.4 10*3/uL (ref 0.1–1.0)
Monocytes Relative: 8 %
Neutro Abs: 2.2 10*3/uL (ref 1.7–7.7)
Neutrophils Relative %: 39 %

## 2023-03-29 LAB — PROTIME-INR
INR: 0.9 (ref 0.8–1.2)
Prothrombin Time: 12.5 seconds (ref 11.4–15.2)

## 2023-03-29 LAB — POC URINE PREG, ED: Preg Test, Ur: NEGATIVE

## 2023-03-29 LAB — CBC
HCT: 35.6 % — ABNORMAL LOW (ref 36.0–46.0)
Hemoglobin: 11.3 g/dL — ABNORMAL LOW (ref 12.0–15.0)
MCH: 26.4 pg (ref 26.0–34.0)
MCHC: 31.7 g/dL (ref 30.0–36.0)
MCV: 83.2 fL (ref 80.0–100.0)
Platelets: 305 10*3/uL (ref 150–400)
RBC: 4.28 MIL/uL (ref 3.87–5.11)
RDW: 16 % — ABNORMAL HIGH (ref 11.5–15.5)
WBC: 5.6 10*3/uL (ref 4.0–10.5)
nRBC: 0 % (ref 0.0–0.2)

## 2023-03-29 LAB — APTT: aPTT: 24 seconds (ref 24–36)

## 2023-03-29 MED ORDER — SODIUM CHLORIDE 0.9% FLUSH: 3.0000 mL | Freq: Once | INTRAVENOUS | Status: DC

## 2023-03-29 NOTE — ED Notes (Signed)
Patient verbalizes understanding of discharge instructions. Opportunity for questioning and answers were provided. Armband removed by staff, pt discharged from ED. Ambulated out to lobby  

## 2023-03-29 NOTE — ED Triage Notes (Signed)
Pt reports that at 13:10 she had sudden onset blurred vision which lasted about . Pt reports that after about 10 minutes of blurred vision, at 13:20 she began feeling dizzy for about 5 minutes.  These symptoms have resolved and she denies any other symptoms with this.  Pt has hx of DVT (RLE) and bilateral PE.  Pt is no longer on any blood thinners.  No dizziness or focal weakness at this time.

## 2023-03-29 NOTE — ED Provider Notes (Signed)
[facility] Provider Note    Event Date/Time   First MD Initiated Contact with Patient 03/29/23 1623     (approximate)   History   Dizziness   HPI  Angel Ho is a 36 year old female with history of DVT/PE not on anticoagulation presenting to the emergency department for evaluation of dizziness.  Around 110, patient was at work when she had an episode of blurry vision that lasted for about 20 minutes.  She did additionally report some dizziness described as lightheadedness.  No spinning sensation, no double vision.  Does report that around this time she had a headache, not sudden in onset or different in character than prior.  Does report that she has previously had some paresthesias with her headaches, but presentation today was different.  Currently reports that headache, vision changes, and dizziness have all completely resolved.  She does additionally report that she has had some intermittent tingling in her right lower extremity for several days, no trauma, no noted swelling, erythema, warmth.     Physical Exam   Triage Vital Signs: ED Triage Vitals  Encounter Vitals Group     BP 03/29/23 1554 (!) 160/94     Systolic BP Percentile --      Diastolic BP Percentile --      Pulse Rate 03/29/23 1554 80     Resp 03/29/23 1554 16     Temp 03/29/23 1554 98.6 F (37 C)     Temp Source 03/29/23 1554 Oral     SpO2 03/29/23 1554 100 %     Weight --      Height --      Head Circumference --      Peak Flow --      Pain Score 03/29/23 1559 0     Pain Loc --      Pain Education --      Exclude from Growth Chart --     Most recent vital signs: Vitals:   03/29/23 1554  BP: (!) 160/94  Pulse: 80  Resp: 16  Temp: 98.6 F (37 C)  SpO2: 100%     General: Awake, interactive  CV:  Regular rate, good peripheral perfusion.  Resp:  Lungs clear, unlabored respirations.  Abd:  Soft, nondistended.  MSK:  Perhaps minimally increased swelling of the  right lower extremity compared to the other side.  No tenderness to palpation over the calf or remainder of the leg.  No erythema, warmth, overlying skin changes. Neuro:  Keenly aware, correctly answers month and age, able to blink eyes and squeeze hands, normal horizontal extraocular movements, no visual field loss, normal facial symmetry, no arm or leg motor drift, 5 out of 5 strength in the bilateral upper and lower extremities, no limb ataxia, normal sensation, no aphasia, no dysarthria, no inattention, normal gait   ED Results / Procedures / Treatments   Labs (all labs ordered are listed, but only abnormal results are displayed) Labs Reviewed  CBC - Abnormal; Notable for the following components:      Result Value   Hemoglobin 11.3 (*)    HCT 35.6 (*)    RDW 16.0 (*)    All other components within normal limits  COMPREHENSIVE METABOLIC PANEL - Abnormal; Notable for the following components:   Calcium 8.7 (*)    Total Protein 6.3 (*)    Albumin 3.3 (*)    All other components within normal limits  PROTIME-INR  APTT  DIFFERENTIAL  POC URINE PREG,  ED     EKG EKG independently reviewed interpreted by myself (ER attending) demonstrates:  EKG demonstrates normal sinus rhythm at rate of 95, PR 152, QRS 94, QTc 429, no acute ST changes  RADIOLOGY Imaging independently reviewed and interpreted by myself demonstrates:  CT head without acute bleed Ultrasound of the right lower extremity without obvious DVT on my review, formal radiology read pending  PROCEDURES:  Critical Care performed: No  Procedures   MEDICATIONS ORDERED IN ED: Medications  sodium chloride flush (NS) 0.9 % injection 3 mL (3 mLs Intravenous Not Given 03/29/23 1757)     IMPRESSION / MDM / ASSESSMENT AND PLAN / ED COURSE  I reviewed the triage vital signs and the nursing notes.  Differential diagnosis includes, but is not limited to, migraine with aura, lower suspicion TIA, low suspicion for acute stroke  given absence of any ongoing deficits, anemia, electrolyte abnormality, DVT, no cardiorespiratory symptoms suggestive of PE  Patient's presentation is most consistent with acute presentation with potential threat to life or bodily function.  37 year old female presenting to the ER for evaluation of dizziness and blurred vision, resolved at time of presentation.  Labs without critical derangements.  CT head reassuring.  Overall low suspicion for TIA, but would be low risk ABCD 2 score of 2.  Do think she is stable for discharge with outpatient follow-up for this. Ultrasound of the right lower extremity ordered, radiology read pending.  Patient eager to be discharged home.  If she does have a DVT, her vital signs are stable, do think she would be reasonable for outpatient treatment.  With this, we will plan to discharge and I will contact the patient should her DVT study returned positive.  2015: After discharge, patient's DVT study did result.  This demonstrated possible remnant from prior DVT, but no acute findings.      FINAL CLINICAL IMPRESSION(S) / ED DIAGNOSES   Final diagnoses:  Dizziness  Blurred vision  Pain of right lower extremity     Rx / DC Orders   ED Discharge Orders     None        Note:  This document was prepared using Dragon voice recognition software and may include unintentional dictation errors.   Trinna Post, MD 03/29/23 2015

## 2023-03-29 NOTE — Discharge Instructions (Signed)
You were seen in the ER today for your blurred vision and dizziness.  Your testing was overall reassuring.  You did mention some discomfort in your leg so we did order an ultrasound.  The formal interpretation of this is pending.  If this does demonstrate a blood clot, I will call you.  Follow-up with your primary care doctor for further evaluation of your symptoms.  Return to the ER for new or worsening symptoms.

## 2023-05-22 DIAGNOSIS — J069 Acute upper respiratory infection, unspecified: Secondary | ICD-10-CM | POA: Diagnosis not present

## 2023-05-22 DIAGNOSIS — R0982 Postnasal drip: Secondary | ICD-10-CM | POA: Diagnosis not present

## 2023-07-02 DIAGNOSIS — N760 Acute vaginitis: Secondary | ICD-10-CM | POA: Diagnosis not present

## 2023-07-02 DIAGNOSIS — B9689 Other specified bacterial agents as the cause of diseases classified elsewhere: Secondary | ICD-10-CM | POA: Diagnosis not present

## 2023-10-24 DIAGNOSIS — Z01419 Encounter for gynecological examination (general) (routine) without abnormal findings: Secondary | ICD-10-CM | POA: Diagnosis not present

## 2023-10-24 DIAGNOSIS — N946 Dysmenorrhea, unspecified: Secondary | ICD-10-CM | POA: Diagnosis not present

## 2023-10-24 DIAGNOSIS — Z113 Encounter for screening for infections with a predominantly sexual mode of transmission: Secondary | ICD-10-CM | POA: Diagnosis not present

## 2023-10-24 DIAGNOSIS — R87610 Atypical squamous cells of undetermined significance on cytologic smear of cervix (ASC-US): Secondary | ICD-10-CM | POA: Diagnosis not present

## 2023-10-24 LAB — RESULTS CONSOLE HPV: CHL HPV: NEGATIVE

## 2023-10-24 LAB — HM PAP SMEAR: HM Pap smear: NORMAL

## 2023-12-12 DIAGNOSIS — Z3202 Encounter for pregnancy test, result negative: Secondary | ICD-10-CM | POA: Diagnosis not present

## 2023-12-12 DIAGNOSIS — N898 Other specified noninflammatory disorders of vagina: Secondary | ICD-10-CM | POA: Diagnosis not present

## 2023-12-12 DIAGNOSIS — Z113 Encounter for screening for infections with a predominantly sexual mode of transmission: Secondary | ICD-10-CM | POA: Diagnosis not present

## 2023-12-31 DIAGNOSIS — Z113 Encounter for screening for infections with a predominantly sexual mode of transmission: Secondary | ICD-10-CM | POA: Diagnosis not present

## 2024-02-13 ENCOUNTER — Emergency Department

## 2024-02-13 ENCOUNTER — Other Ambulatory Visit: Payer: Self-pay

## 2024-02-13 ENCOUNTER — Emergency Department
Admission: EM | Admit: 2024-02-13 | Discharge: 2024-02-14 | Disposition: A | Discharge status: Home / Self Care | Attending: Emergency Medicine | Admitting: Emergency Medicine

## 2024-02-13 ENCOUNTER — Encounter: Payer: Self-pay | Admitting: *Deleted

## 2024-02-13 DIAGNOSIS — R42 Dizziness and giddiness: Secondary | ICD-10-CM | POA: Insufficient documentation

## 2024-02-13 DIAGNOSIS — R590 Localized enlarged lymph nodes: Secondary | ICD-10-CM | POA: Diagnosis not present

## 2024-02-13 LAB — CBC
HCT: 34.2 % — ABNORMAL LOW (ref 36.0–46.0)
Hemoglobin: 10.7 g/dL — ABNORMAL LOW (ref 12.0–15.0)
MCH: 25.6 pg — ABNORMAL LOW (ref 26.0–34.0)
MCHC: 31.3 g/dL (ref 30.0–36.0)
MCV: 81.8 fL (ref 80.0–100.0)
Platelets: 302 K/uL (ref 150–400)
RBC: 4.18 MIL/uL (ref 3.87–5.11)
RDW: 16.9 % — ABNORMAL HIGH (ref 11.5–15.5)
WBC: 5.2 K/uL (ref 4.0–10.5)
nRBC: 0 % (ref 0.0–0.2)

## 2024-02-13 LAB — BASIC METABOLIC PANEL WITH GFR
Anion gap: 7 (ref 5–15)
BUN: 11 mg/dL (ref 6–20)
CO2: 23 mmol/L (ref 22–32)
Calcium: 9 mg/dL (ref 8.9–10.3)
Chloride: 107 mmol/L (ref 98–111)
Creatinine, Ser: 0.8 mg/dL (ref 0.44–1.00)
GFR, Estimated: >60 mL/min (ref >60)
Glucose, Bld: 86 mg/dL (ref 70–99)
Potassium: 4 mmol/L (ref 3.5–5.1)
Sodium: 137 mmol/L (ref 135–145)

## 2024-02-13 LAB — PROTIME-INR
INR: 0.9 (ref 0.8–1.2)
Prothrombin Time: 13.1 s (ref 11.4–15.2)

## 2024-02-13 LAB — POC URINE PREG, ED: Preg Test, Ur: NEGATIVE

## 2024-02-13 LAB — TROPONIN I (HIGH SENSITIVITY)
Troponin I (High Sensitivity): <2 ng/L (ref <18)
Troponin I (High Sensitivity): <2 ng/L (ref <18)

## 2024-02-13 NOTE — ED Notes (Signed)
 ED Provider at bedside.

## 2024-02-13 NOTE — ED Provider Notes (Signed)
 [facility] Provider Note    Event Date/Time   First MD Initiated Contact with Patient 02/13/24 2331     (approximate)   History   Dizziness   HPI  Angel Ho is a 38 y.o. female who presents to the ED for evaluation of Dizziness   Review ED visit from 11 months ago where patient was seen for dizziness, headache, benign workup and discharged.  Patient presents for a few months of intermittent presyncopal dizziness.  Reports it happens throughout the day at seemingly random times, often while seated or laying in bed at night.  No actual syncope, falls or injuries.  No chest pain, shortness of breath.  Fever.  Reports that she wanted to make sure she did not have another blood clot in her lungs, which she had about 8 or 10 years ago   Physical Exam   Triage Vital Signs: ED Triage Vitals  Encounter Vitals Group     BP 02/13/24 1809 (!) 168/114     Girls Systolic BP Percentile --      Girls Diastolic BP Percentile --      Boys Systolic BP Percentile --      Boys Diastolic BP Percentile --      Pulse Rate 02/13/24 1809 91     Resp 02/13/24 1809 18     Temp 02/13/24 1809 98.7 F (37.1 C)     Temp Source 02/13/24 1809 Oral     SpO2 02/13/24 1809 100 %     Weight 02/13/24 1806 245 lb (111.1 kg)     Height 02/13/24 1806 5' 2 (1.575 m)     Head Circumference --      Peak Flow --      Pain Score 02/13/24 1806 8     Pain Loc --      Pain Education --      Exclude from Growth Chart --     Most recent vital signs: Vitals:   02/14/24 0100 02/14/24 0203  BP: (!) 136/98   Pulse: 74   Resp:    Temp:  98.1 F (36.7 C)  SpO2:      General: Awake, no distress.  CV:  Good peripheral perfusion.  Resp:  Normal effort.  Abd:  No distention.  MSK:  No deformity noted.  Neuro:  No focal deficits appreciated. Other:     ED Results / Procedures / Treatments   Labs (all labs ordered are listed, but only abnormal results are displayed) Labs  Reviewed  CBC - Abnormal; Notable for the following components:      Result Value   Hemoglobin 10.7 (*)    HCT 34.2 (*)    MCH 25.6 (*)    RDW 16.9 (*)    All other components within normal limits  D-DIMER, QUANTITATIVE - Abnormal; Notable for the following components:   D-Dimer, Quant 0.85 (*)    All other components within normal limits  BASIC METABOLIC PANEL WITH GFR  PROTIME-INR  POC URINE PREG, ED  TROPONIN I (HIGH SENSITIVITY)  TROPONIN I (HIGH SENSITIVITY)    EKG Sinus rhythm with a rate of 86 bpm.  Normal axis and intervals without evidence of acute ischemia.  RADIOLOGY CXR interpreted by me without evidence of acute cardiopulmonary pathology.  Official radiology report(s): CT Angio Chest PE W and/or Wo Contrast Result Date: 02/14/2024 CLINICAL DATA:  Headache and dizziness. EXAM: CT ANGIOGRAPHY CHEST WITH CONTRAST TECHNIQUE: Multidetector CT imaging of the chest was performed using  the standard protocol during bolus administration of intravenous contrast. Multiplanar CT image reconstructions and MIPs were obtained to evaluate the vascular anatomy. RADIATION DOSE REDUCTION: This exam was performed according to the departmental dose-optimization program which includes automated exposure control, adjustment of the mA and/or kV according to patient size and/or use of iterative reconstruction technique. CONTRAST:  OMNIPAQUE  IOHEXOL  350 MG/ML SOLN COMPARISON:  August 13, 2015 FINDINGS: Cardiovascular: Satisfactory opacification of the pulmonary arteries to the segmental level. No evidence of pulmonary embolism. Normal heart size. No pericardial effusion. Mediastinum/Nodes: Mild AP window, pretracheal and bilateral hilar lymphadenopathy is noted. Thyroid gland, trachea, and esophagus demonstrate no significant findings. Lungs/Pleura: Lungs are clear. No pleural effusion or pneumothorax. Upper Abdomen: No acute abnormality. Musculoskeletal: No chest wall abnormality. No acute or  significant osseous findings. Review of the MIP images confirms the above findings. IMPRESSION: 1. No evidence of pulmonary embolism or other acute intrathoracic process. 2. Mild mediastinal and bilateral hilar lymphadenopathy, likely reactive. Electronically Signed   By: Suzen Dials M.D.   On: 02/14/2024 01:49   DG Chest 2 View Result Date: 02/13/2024 CLINICAL DATA:  Dizzy. EXAM: CHEST - 2 VIEW COMPARISON:  Radiograph 08/24/2015 FINDINGS: The cardiomediastinal contours are normal. The lungs are clear. Pulmonary vasculature is normal. No consolidation, pleural effusion, or pneumothorax. No acute osseous abnormalities are seen. IMPRESSION: Negative radiographs of the chest. Electronically Signed   By: Andrea Gasman M.D.   On: 02/13/2024 20:16    PROCEDURES and INTERVENTIONS:  .1-3 Lead EKG Interpretation  Performed by: Claudene Rover, MD Authorized by: Claudene Rover, MD     Interpretation: normal     ECG rate:  70   ECG rate assessment: normal     Rhythm: sinus rhythm     Ectopy: none     Conduction: normal     Medications  iohexol  (OMNIPAQUE ) 350 MG/ML injection 100 mL (100 mLs Intravenous Contrast Given 02/14/24 0108)     IMPRESSION / MDM / ASSESSMENT AND PLAN / ED COURSE  I reviewed the triage vital signs and the nursing notes.  Differential diagnosis includes, but is not limited to, symptomatic anemia, cardiac dysrhythmia, anxiety or panic attacks, PE, dehydration or AKI, pregnancy  {Patient presents with symptoms of an acute illness or injury that is potentially life-threatening.  Patient presents with chronic intermittent presyncopal dizziness .  Looks well to me without evidence of cardiac dysrhythmia.  Chronic and mild normocytic anemia without bleeding symptoms.  Normal metabolic panel, troponin and negative pregnancy test.  Add D-dimer to screen for PE.  D-dimer is positive, PE study without evidence of acute pathology.  Suitable for outpatient management.  Referred to  PCP.      FINAL CLINICAL IMPRESSION(S) / ED DIAGNOSES   Final diagnoses:  Dizziness     Rx / DC Orders   ED Discharge Orders          Ordered    Ambulatory Referral to Primary Care (Establish Care)        02/14/24 0157             Note:  This document was prepared using Dragon voice recognition software and may include unintentional dictation errors.   Claudene Rover, MD 02/14/24 [phone]

## 2024-02-13 NOTE — ED Triage Notes (Signed)
 Pt ambulatory to triage.  Pt reports dizziness for 6 months.  Pt reports a headache since 1100 am today.   No slurred speech.  No chest pain or sob.  Hx of dvt, pe.   Pt alert   speech clear.

## 2024-02-14 ENCOUNTER — Emergency Department

## 2024-02-14 DIAGNOSIS — R42 Dizziness and giddiness: Secondary | ICD-10-CM | POA: Diagnosis not present

## 2024-02-14 DIAGNOSIS — R590 Localized enlarged lymph nodes: Secondary | ICD-10-CM | POA: Diagnosis not present

## 2024-02-14 LAB — D-DIMER, QUANTITATIVE: D-Dimer, Quant: 0.85 ug{FEU}/mL — ABNORMAL HIGH (ref 0.00–0.50)

## 2024-02-14 MED ORDER — IOHEXOL 350 MG/ML SOLN
100.0000 mL | Freq: Once | INTRAVENOUS | Status: AC | PRN
  Administered 2024-02-14: 100 mL via INTRAVENOUS

## 2024-02-14 NOTE — ED Notes (Signed)
 Patient transported to CT

## 2024-02-14 NOTE — Discharge Instructions (Signed)
 As you discussed, keep track of your blood pressure at home, particularly when you are feeling normal in your normal environment.   We referred you to a PCP, they should call you to schedule an appointment in the clinic  Return to the ED with any episodes of passing out all the way or other concerns

## 2024-02-26 ENCOUNTER — Encounter: Payer: Self-pay | Admitting: General Practice

## 2024-02-26 ENCOUNTER — Ambulatory Visit: Admitting: General Practice

## 2024-02-26 VITALS — BP 118/80 | HR 72 | Temp 98.2°F | Ht 64.0 in | Wt 249.0 lb

## 2024-02-26 DIAGNOSIS — D500 Iron deficiency anemia secondary to blood loss (chronic): Secondary | ICD-10-CM | POA: Diagnosis not present

## 2024-02-26 DIAGNOSIS — Z7689 Persons encountering health services in other specified circumstances: Secondary | ICD-10-CM

## 2024-02-26 DIAGNOSIS — I2782 Chronic pulmonary embolism: Secondary | ICD-10-CM

## 2024-02-26 DIAGNOSIS — I10 Essential (primary) hypertension: Secondary | ICD-10-CM

## 2024-02-26 MED ORDER — IRON (FERROUS SULFATE) 325 (65 FE) MG PO TABS: 325.0000 mg | ORAL_TABLET | Freq: Every day | ORAL | 2 refills | Status: DC

## 2024-02-26 MED ORDER — AMLODIPINE BESYLATE 5 MG PO TABS: 5.0000 mg | ORAL_TABLET | Freq: Every day | ORAL | 0 refills | Status: DC

## 2024-02-26 NOTE — Patient Instructions (Signed)
 Start monitoring your blood pressure daily, around the same time of day, for the next 2-3 weeks.  Ensure that you have rested for 30 minutes prior to checking your blood pressure.   Record your readings and notify me if you see numbers consistently at or above 130 on top and/or 90 on bottom.  Start Amlodipine  5 mg once daily.   Start Ferrous Sulfate  325 mg once daily. It can cause constipation, please make sure you are drinking enough water, increasing fiber intake and if needed, stool softener over the counter.   Follow up in 2 weeks.   It was a pleasure to meet you today! Please don't hesitate to contact me with any questions. Welcome to [facility]!

## 2024-02-26 NOTE — Progress Notes (Signed)
 New Patient Office Visit  Subjective    Patient ID: Angel Ho, female    DOB: Mar 06, 1986  Age: 38 y.o. MRN: [id number]  CC:  Chief Complaint  Patient presents with   New Patient (Initial Visit)   Hypertension    Most likely been elevated x couple months due to headaches and dizziness but just found out its been elevated at hospital visit recently.     HPI Angel Ho is a 38 y.o. female presents to establish care.  Last PCP/physical/labs: no recent PCP/ labs at the hospital on 02/13/24  Discussed the use of AI scribe software for clinical note transcription with the patient, who gave verbal consent to proceed.  History of Present Illness She is establishing care for hypertension management after two emergency room visits due to high blood pressure, one on February 13, 2024, and another on March 29, 2023, both at [facility]. During these visits, her blood pressure was noted to be high, but she was not started on any medication. She has been monitoring her blood pressure at home since mid-August 2024, with readings ranging from 132/96 to 150/101. She uses an arm cuff for measurements and has also had her blood pressure checked by medical staff at her workplace. She experiences headaches but denies dizziness, blurred vision, chest pain, shortness of breath, or urinary symptoms.   She has a history of pulmonary embolism in 2017, for which she was on blood thinners temporarily. She is not currently on anticoagulation therapy.   Her family history is notable for a paternal aunt with breast cancer, though the age of diagnosis is unknown. She is unsure if her grandmother has hypertension.   She has a history of anemia, with recent hemoglobin levels of 11.3 in September and 10.7 in August. She experiences heavy menstrual cycles and has started taking over-the-counter iron  supplements provided by a friend, though she is unsure of the dosage. She reports a decline in energy  and occasional constipation, which she associates with iron  supplementation.  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   Outpatient Encounter Medications as of 02/26/2024  Medication Sig   amLODipine  (NORVASC ) 5 MG tablet Take 1 tablet (5 mg total) by mouth daily.   Iron , Ferrous Sulfate , 325 (65 Fe) MG TABS Take 325 mg by mouth daily.   [DISCONTINUED] docusate sodium  (COLACE) 50 MG capsule Take 1 capsule (50 mg total) by mouth 2 (two) times daily. As instructed   [DISCONTINUED] enoxaparin  (LOVENOX ) 100 MG/ML injection Inject 1 mL (100 mg total) into the skin every 12 (twelve) hours.   [DISCONTINUED] oxyCODONE -acetaminophen  (ROXICET) 5-325 MG tablet Take 1-2 tablets by mouth every 6 (six) hours as needed.   [DISCONTINUED] Prenatal Vit-Fe Fumarate-FA (PRENATAL MULTIVITAMIN) TABS tablet Take 1 tablet by mouth daily at 12 noon.   [DISCONTINUED] Prenatal Vit-Fe Fumarate-FA (PRENATAL MULTIVITAMIN) TABS tablet Take 1 tablet by mouth daily at 12 noon.   [DISCONTINUED] triamcinolone  (KENALOG ) 0.025 % cream Apply 1 application topically 2 (two) times daily.   No facility-administered encounter medications on file as of 02/26/2024.    Past Medical History:  Diagnosis Date   Factor V Leiden mutation affecting pregnancy (HCC)    Frequent headaches    No pertinent past medical history    Pulmonary embolism (HCC) 08/13/2015    Past Surgical History:  Procedure Laterality Date   CESAREAN SECTION N/A 08/04/2016   Procedure: CESAREAN SECTION;  Surgeon: Jon Rummer, MD;  Location: [facility] BIRTHING SUITES;  Service: Obstetrics;  Laterality: N/A;   NO PAST SURGERIES     TUBAL LIGATION  08/04/2016    Family History  Problem Relation Age of Onset   Diabetes Father    Diabetes Brother    Arthritis Maternal Grandmother    Diabetes Maternal Grandmother    Diabetes Other     Social History   Socioeconomic History   Marital status: Married    Spouse name: Not on  file   Number of children: Not on file   Years of education: Not on file   Highest education level: Associate degree: occupational, Scientist, product/process development, or vocational program  Occupational History   Not on file  Tobacco Use   Smoking status: Some Days    Current packs/day: 0.00    Types: Cigarettes    Last attempt to quit: 04/08/2011    Years since quitting: 12.8   Smokeless tobacco: Never  Vaping Use   Vaping status: Never Used  Substance and Sexual Activity   Alcohol use: Yes    Comment: occasional   Drug use: No   Sexual activity: Yes    Birth control/protection: Surgical  Other Topics Concern   Not on file  Social History Narrative   Not on file   Social Drivers of Health   Financial Resource Strain: Low Risk  (02/26/2024)   Overall Financial Resource Strain (CARDIA)    Difficulty of Paying Living Expenses: Not very hard  Food Insecurity: No Food Insecurity (02/26/2024)   Hunger Vital Sign    Worried About Running Out of Food in the Last Year: Never true    Ran Out of Food in the Last Year: Never true  Transportation Needs: No Transportation Needs (02/26/2024)   PRAPARE - Administrator, Civil Service (Medical): No    Lack of Transportation (Non-Medical): No  Physical Activity: Inactive (02/26/2024)   Exercise Vital Sign    Days of Exercise per Week: 0 days    Minutes of Exercise per Session: Not on file  Stress: Stress Concern Present (02/26/2024)   [employer] of Occupational Health - Occupational Stress Questionnaire    Feeling of Stress: To some extent  Social Connections: Moderately Integrated (02/26/2024)   Social Connection and Isolation Panel    Frequency of Communication with Friends and Family: Three times a week    Frequency of Social Gatherings with Friends and Family: Never    Attends Religious Services: More than 4 times per year    Active Member of [employer] or Organizations: No    Attends Engineer, structural: Not on file    Marital  Status: Married  Catering manager Violence: Not on file    Review of Systems  Constitutional:  Negative for chills and fever.  Eyes:  Negative for blurred vision.  Respiratory:  Negative for shortness of breath.   Cardiovascular:  Positive for leg swelling. Negative for chest pain.  Gastrointestinal:  Negative for abdominal pain, constipation, diarrhea, heartburn, nausea and vomiting.  Genitourinary:  Negative for dysuria, frequency and urgency.  Neurological:  Positive for dizziness and headaches.  Endo/Heme/Allergies:  Negative for polydipsia.  Psychiatric/Behavioral:  Negative for depression and suicidal ideas. The patient is not nervous/anxious.         Objective    BP 118/80   Pulse 72   Temp 98.2 F (36.8 C) (Oral)   Ht 5' 4 (1.626 m)   Wt 249 lb (112.9 kg)   LMP 02/14/2024 (Approximate)   SpO2 98%   Breastfeeding  No   BMI 42.74 kg/m   Physical Exam Vitals and nursing note reviewed.  Constitutional:      Appearance: Normal appearance.  Cardiovascular:     Rate and Rhythm: Normal rate and regular rhythm.     Pulses: Normal pulses.     Heart sounds: Normal heart sounds.  Pulmonary:     Effort: Pulmonary effort is normal.     Breath sounds: Normal breath sounds.  Neurological:     Mental Status: She is alert and oriented to person, place, and time.  Psychiatric:        Mood and Affect: Mood normal.        Behavior: Behavior normal.        Thought Content: Thought content normal.        Judgment: Judgment normal.         Assessment & Plan:  Primary hypertension -     amLODIPine  Besylate; Take 1 tablet (5 mg total) by mouth daily.  Dispense: 30 tablet; Refill: 0  Establishing care with new doctor, encounter for  Iron  deficiency anemia due to chronic blood loss -     Iron  (Ferrous Sulfate ); Take 325 mg by mouth daily.  Dispense: 30 tablet; Refill: 2  Other chronic pulmonary embolism without acute cor pulmonale (HCC) Assessment & Plan: No concerns  today.     Assessment and Plan Assessment & Plan Hypertension with lower extremity edema Chronic hypertension with recent elevated readings. Current BP 118/80 mmHg.  - Start amlodipine  5 mg once daily. Discuss potential side effects. - Continue daily blood pressure monitoring and maintain a log. - Advise wearing compression socks and elevating legs to manage edema. - Follow up in two weeks to reassess blood pressure and symptoms.  Iron  deficiency anemia Iron  deficiency anemia with recent hemoglobin levels of 11.3 g/dL and 89.2 g/dL, likely due to heavy menstrual cycles. Symptoms include fatigue and potential constipation from iron  supplementation. - Start ferrous sulfate  325 mg once daily with vitamin C to enhance absorption. - Advise increasing water and fiber intake to manage constipation. Consider stool softener if needed. - Recheck labs in two weeks to assess hemoglobin and iron  levels.  General Health Maintenance Tetanus vaccination expired in 2023. Family history of breast cancer noted; advised to confirm age of diagnosis for paternal aunt to determine need for earlier mammogram screening. - Update tetanus vaccination during next visit. - Confirm age of breast cancer diagnosis for paternal aunt to determine need for earlier mammogram screening.   Return in about 2 weeks (around 03/11/2024) for HTN, tdap, fasting labs.SABRA Carrol Aurora, NP

## 2024-02-26 NOTE — Assessment & Plan Note (Signed)
 No concerns today

## 2024-03-10 ENCOUNTER — Ambulatory Visit: Admitting: General Practice

## 2024-03-11 ENCOUNTER — Other Ambulatory Visit

## 2024-03-12 ENCOUNTER — Ambulatory Visit: Admitting: General Practice

## 2024-03-14 ENCOUNTER — Encounter: Payer: Self-pay | Admitting: General Practice

## 2024-03-14 ENCOUNTER — Ambulatory Visit: Payer: Self-pay | Admitting: General Practice

## 2024-03-14 ENCOUNTER — Ambulatory Visit (INDEPENDENT_AMBULATORY_CARE_PROVIDER_SITE_OTHER)
Admission: RE | Admit: 2024-03-14 | Discharge: 2024-03-14 | Disposition: A | Discharge status: Home / Self Care | Source: Ambulatory Visit | Attending: General Practice | Admitting: General Practice

## 2024-03-14 ENCOUNTER — Ambulatory Visit: Admitting: General Practice

## 2024-03-14 VITALS — BP 118/82 | HR 77 | Temp 98.0°F | Ht 64.0 in | Wt 249.4 lb

## 2024-03-14 DIAGNOSIS — D500 Iron deficiency anemia secondary to blood loss (chronic): Secondary | ICD-10-CM | POA: Diagnosis not present

## 2024-03-14 DIAGNOSIS — I10 Essential (primary) hypertension: Secondary | ICD-10-CM

## 2024-03-14 DIAGNOSIS — W19XXXA Unspecified fall, initial encounter: Secondary | ICD-10-CM

## 2024-03-14 DIAGNOSIS — Z23 Encounter for immunization: Secondary | ICD-10-CM | POA: Diagnosis not present

## 2024-03-14 DIAGNOSIS — M25561 Pain in right knee: Secondary | ICD-10-CM

## 2024-03-14 DIAGNOSIS — M25461 Effusion, right knee: Secondary | ICD-10-CM

## 2024-03-14 DIAGNOSIS — M7989 Other specified soft tissue disorders: Secondary | ICD-10-CM | POA: Diagnosis not present

## 2024-03-14 LAB — BASIC METABOLIC PANEL WITH GFR
BUN: 10 mg/dL (ref 6–23)
CO2: 28 meq/L (ref 19–32)
Calcium: 9.1 mg/dL (ref 8.4–10.5)
Chloride: 107 meq/L (ref 96–112)
Creatinine, Ser: 0.82 mg/dL (ref 0.40–1.20)
GFR: 91.78 mL/min (ref >60.00)
Glucose, Bld: 93 mg/dL (ref 70–99)
Potassium: 3.9 meq/L (ref 3.5–5.1)
Sodium: 141 meq/L (ref 135–145)

## 2024-03-14 LAB — LIPID PANEL
Cholesterol: 175 mg/dL (ref 0–200)
HDL: 44.1 mg/dL (ref >39.00)
LDL Cholesterol: 118 mg/dL — ABNORMAL HIGH (ref 0–99)
NonHDL: 130.65
Total CHOL/HDL Ratio: 4
Triglycerides: 65 mg/dL (ref 0.0–149.0)
VLDL: 13 mg/dL (ref 0.0–40.0)

## 2024-03-14 LAB — IBC + FERRITIN
Ferritin: 13.9 ng/mL (ref 10.0–291.0)
Iron: 143 ug/dL (ref 42–145)
Saturation Ratios: 36.7 % (ref 20.0–50.0)
TIBC: 389.2 ug/dL (ref 250.0–450.0)
Transferrin: 278 mg/dL (ref 212.0–360.0)

## 2024-03-14 LAB — CBC
HCT: 36.6 % (ref 36.0–46.0)
Hemoglobin: 11.9 g/dL — ABNORMAL LOW (ref 12.0–15.0)
MCHC: 32.4 g/dL (ref 30.0–36.0)
MCV: 83.6 fl (ref 78.0–100.0)
Platelets: 298 K/uL (ref 150.0–400.0)
RBC: 4.38 Mil/uL (ref 3.87–5.11)
RDW: 18.8 % — ABNORMAL HIGH (ref 11.5–15.5)
WBC: 3.4 K/uL — ABNORMAL LOW (ref 4.0–10.5)

## 2024-03-14 MED ORDER — AMLODIPINE BESYLATE 5 MG PO TABS: 5.0000 mg | ORAL_TABLET | Freq: Every day | ORAL | 1 refills | Status: DC

## 2024-03-14 NOTE — Patient Instructions (Addendum)
 Stop by the lab prior to leaving today. I will notify you of your results once received.   Refill sent Amlodipine  5 mg once daily. Refill sent.   Physical in 6 months.   It was a pleasure to see you today!

## 2024-03-14 NOTE — Assessment & Plan Note (Signed)
 BP at goal.  Continue Amlodipine  5 mg once daily.  Refill sent.  BMP pending.  F/u in 6 months.

## 2024-03-14 NOTE — Assessment & Plan Note (Signed)
 Swelling present on exam.  Differentials include joint effusion, OA.  X-ray pending.  Consider ortho referral.

## 2024-03-14 NOTE — Assessment & Plan Note (Signed)
 Continue Ferrous sulfate  daily.  Cbc, iron  and ferritin pending.

## 2024-03-14 NOTE — Progress Notes (Signed)
 Established Patient Office Visit  Subjective   Patient ID: Angel Ho, female    DOB: [birth year]  Age: 38 y.o. MRN: [id number]  Chief Complaint  Patient presents with   Hypertension    Patient here today for BP follow up; currently taking amlodipine  5 mg tabs daily. BP is doing well.     Hypertension Pertinent negatives include no chest pain, headaches or shortness of breath.    Angel Ho is a 38 year old female with past medical history of HTN, IDA, obesity presents today for a follow up on HTN.   HTN: She was evaluated on 02/26/24 and was started on Amlodipine  5 mg once daily. She has been tolerating her medication well. The home BP readings have been in the 110-120's / 80's range. Swelling is improving. Denies any headaches, blurred vision, chest pain or shortness of breath.   Fall: she was wearing crocs and was playing basketball with her son in the kitchen when she tripped and fell. Since then she developed right knee pain and swelling. It is tender to touch. Pain initially was constant but now it is intermittent. Only present with touch. Pain does not radiate down to her ankle. She does report improvement in swelling after elevating her legs and she wears compression stockings but the swelling persists otherwise.   Patient Active Problem List   Diagnosis Date Noted   Primary hypertension 03/14/2024   Fall 03/14/2024   Pain and swelling of right knee 03/14/2024   Neutropenia (HCC) 11/14/2016   Status post bilateral salpingectomy 08/05/2016   Iron  deficiency anemia 08/04/2016   Factor V Leiden mutation affecting pregnancy (HCC) 08/04/2016   Morbid obesity with BMI of 40.0-44.9, adult (HCC) 08/04/2016   Past Medical History:  Diagnosis Date   Factor V Leiden mutation affecting pregnancy (HCC)    Frequent headaches    No pertinent past medical history    Primary hypertension 03/14/2024   Pulmonary embolism (HCC) 08/13/2015   Past Surgical History:  Procedure  Laterality Date   CESAREAN SECTION N/A 08/04/2016   Procedure: CESAREAN SECTION;  Surgeon: Jon Rummer, MD;  Location: [facility];  Service: Obstetrics;  Laterality: N/A;   NO PAST SURGERIES     TUBAL LIGATION  08/04/2016   No Known Allergies       03/14/2024    8:40 AM 02/26/2024    9:03 AM  Depression screen PHQ 2/9  Decreased Interest 0 0  Down, Depressed, Hopeless 0 1  PHQ - 2 Score 0 1  Altered sleeping 0 0  Tired, decreased energy 1 1  Change in appetite 1 1  Feeling bad or failure about yourself  0 0  Trouble concentrating 0 0  Moving slowly or fidgety/restless 0 0  Suicidal thoughts 0 0  PHQ-9 Score 2 3  Difficult doing work/chores Not difficult at all Not difficult at all       03/14/2024    8:41 AM 02/26/2024    9:04 AM  GAD 7 : Generalized Anxiety Score  Nervous, Anxious, on Edge 0 0  Control/stop worrying 0 0  Worry too much - different things 0 1  Trouble relaxing 0 0  Restless 0 0  Easily annoyed or irritable 1 1  Afraid - awful might happen 0 0  Total GAD 7 Score 1 2  Anxiety Difficulty Not difficult at all Not difficult at all      Review of Systems  Constitutional:  Negative for chills and fever.  Respiratory:  Negative for shortness of breath.   Cardiovascular:  Negative for chest pain.  Gastrointestinal:  Negative for abdominal pain, constipation, diarrhea, heartburn, nausea and vomiting.  Genitourinary:  Negative for dysuria, frequency and urgency.  Musculoskeletal:  Positive for joint pain.  Neurological:  Negative for dizziness and headaches.  Endo/Heme/Allergies:  Negative for polydipsia.  Psychiatric/Behavioral:  Negative for depression and suicidal ideas. The patient is not nervous/anxious.       Objective:     BP 118/82   Pulse 77   Temp 98 F (36.7 C) (Temporal)   Ht 5' 4 (1.626 m)   Wt 249 lb 6.4 oz (113.1 kg)   LMP 03/11/2024 (Exact Date)   SpO2 99%   BMI 42.81 kg/m  BP Readings from Last 3 Encounters:  03/14/24  118/82  02/26/24 118/80  02/14/24 (!) 136/98   Wt Readings from Last 3 Encounters:  03/14/24 249 lb 6.4 oz (113.1 kg)  02/26/24 249 lb (112.9 kg)  02/13/24 245 lb (111.1 kg)      Physical Exam Vitals and nursing note reviewed.  Constitutional:      Appearance: Normal appearance.  Cardiovascular:     Rate and Rhythm: Normal rate and regular rhythm.     Pulses: Normal pulses.     Heart sounds: Normal heart sounds.  Pulmonary:     Effort: Pulmonary effort is normal.     Breath sounds: Normal breath sounds.  Musculoskeletal:     Right knee: Swelling and effusion present. No erythema, ecchymosis or lacerations. Decreased range of motion. Tenderness present over the medial joint line.  Neurological:     Mental Status: She is alert and oriented to person, place, and time.  Psychiatric:        Mood and Affect: Mood normal.        Behavior: Behavior normal.        Thought Content: Thought content normal.        Judgment: Judgment normal.      Results for orders placed or performed in visit on 03/14/24  Basic metabolic panel with GFR  Result Value Ref Range   Sodium 141 135 - 145 mEq/L   Potassium 3.9 3.5 - 5.1 mEq/L   Chloride 107 96 - 112 mEq/L   CO2 28 19 - 32 mEq/L   Glucose, Bld 93 70 - 99 mg/dL   BUN 10 6 - 23 mg/dL   Creatinine, Ser 9.17 0.40 - 1.20 mg/dL   GFR 08.21 >39.99 mL/min   Calcium 9.1 8.4 - 10.5 mg/dL  CBC  Result Value Ref Range   WBC 3.4 (L) 4.0 - 10.5 K/uL   RBC 4.38 3.87 - 5.11 Mil/uL   Platelets 298.0 150.0 - 400.0 K/uL   Hemoglobin 11.9 (L) 12.0 - 15.0 g/dL   HCT 63.3 63.9 - 53.9 %   MCV 83.6 78.0 - 100.0 fl   MCHC 32.4 30.0 - 36.0 g/dL   RDW 81.1 (H) 88.4 - 84.4 %  IBC + Ferritin  Result Value Ref Range   Iron  143 42 - 145 ug/dL   Transferrin 721.9 787.9 - 360.0 mg/dL   Saturation Ratios 63.2 20.0 - 50.0 %   Ferritin 13.9 10.0 - 291.0 ng/mL   TIBC 389.2 250.0 - 450.0 mcg/dL  Lipid panel  Result Value Ref Range   Cholesterol 175 0 - 200  mg/dL   Triglycerides 34.9 0.0 - 149.0 mg/dL   HDL 55.89 >60.99 mg/dL   VLDL 86.9 0.0 - 59.9 mg/dL   LDL  Cholesterol 118 (H) 0 - 99 mg/dL   Total CHOL/HDL Ratio 4    NonHDL 130.65        The ASCVD Risk score (Arnett DK, et al., 2019) failed to calculate for the following reasons:   The 2019 ASCVD risk score is only valid for ages 54 to 24    Assessment & Plan:  Primary hypertension Assessment & Plan: BP at goal.  Continue Amlodipine  5 mg once daily.  Refill sent.  BMP pending.  F/u in 6 months.  Orders: -     amLODIPine  Besylate; Take 1 tablet (5 mg total) by mouth daily.  Dispense: 90 tablet; Refill: 1 -     Basic metabolic panel with GFR -     Lipid panel  Need for Tdap vaccination -     Tdap vaccine greater than or equal to 7yo IM  Iron  deficiency anemia due to chronic blood loss Assessment & Plan: Continue Ferrous sulfate  daily.  Cbc, iron  and ferritin pending.   Orders: -     CBC -     IBC + Ferritin  Fall, initial encounter Assessment & Plan: Symptoms improving overall.   Orders: -     DG Knee 4 Views W/Patella Right  Pain and swelling of right knee Assessment & Plan: Swelling present on exam.  Differentials include joint effusion, OA.  X-ray pending.  Consider ortho referral.   Orders: -     DG Knee 4 Views W/Patella Right     Return in about 6 months (around 09/11/2024) for physical.    Carrol Aurora, NP

## 2024-03-14 NOTE — Assessment & Plan Note (Signed)
 Symptoms improving overall.

## 2024-04-04 ENCOUNTER — Ambulatory Visit: Admitting: General Practice

## 2024-05-23 ENCOUNTER — Other Ambulatory Visit: Payer: Self-pay

## 2024-05-23 DIAGNOSIS — I10 Essential (primary) hypertension: Secondary | ICD-10-CM

## 2024-05-23 MED ORDER — AMLODIPINE BESYLATE 5 MG PO TABS: 5.0000 mg | ORAL_TABLET | Freq: Every day | ORAL | 1 refills | Status: AC

## 2024-07-15 ENCOUNTER — Other Ambulatory Visit: Payer: Self-pay | Admitting: General Practice

## 2024-07-15 DIAGNOSIS — D500 Iron deficiency anemia secondary to blood loss (chronic): Secondary | ICD-10-CM

## 2024-07-15 NOTE — Telephone Encounter (Unsigned)
 Copied from CRM [id number]. Topic: Clinical - Medication Refill >> Jul 15, 2024 10:23 AM Deleta RAMAN wrote: Medication: Iron , Ferrous Sulfate , 325 (65 Fe) MG TABS  Has the patient contacted their pharmacy? Yes (Agent: If no, request that the patient contact the pharmacy for the refill. If patient does not wish to contact the pharmacy document the reason why and proceed with request.) (Agent: If yes, when and what did the pharmacy advise?)  This is the patient's preferred pharmacy:  [facility] [phone] [address], [place] - [address] [place] [address] Phone: [phone] Fax: [phone]  Is this the correct pharmacy for this prescription? Yes If no, delete pharmacy and type the correct one.   Has the prescription been filled recently? No  Is the patient out of the medication? Yes  Has the patient been seen for an appointment in the last year OR does the patient have an upcoming appointment? Yes  Can we respond through MyChart? Yes  Agent: Please be advised that Rx refills may take up to 3 business days. We ask that you follow-up with your pharmacy.

## 2024-07-16 MED ORDER — IRON (FERROUS SULFATE) 325 (65 FE) MG PO TABS: 325.0000 mg | ORAL_TABLET | Freq: Every day | ORAL | 2 refills | Status: AC

## 2024-09-11 ENCOUNTER — Encounter: Admitting: General Practice

## 2024-09-18 ENCOUNTER — Encounter: Admitting: General Practice
